# Patient Record
Sex: Female | Born: 1950
Health system: Southern US, Community
[De-identification: ages and names within clinical notes are randomized; demographics above are authoritative.]

## PROBLEM LIST (undated history)

## (undated) DIAGNOSIS — K5792 Diverticulitis of intestine, part unspecified, without perforation or abscess without bleeding: Secondary | ICD-10-CM

## (undated) DIAGNOSIS — R0600 Dyspnea, unspecified: Secondary | ICD-10-CM

## (undated) DIAGNOSIS — I3139 Other pericardial effusion (noninflammatory): Secondary | ICD-10-CM

## (undated) DIAGNOSIS — R32 Unspecified urinary incontinence: Secondary | ICD-10-CM

## (undated) DIAGNOSIS — I509 Heart failure, unspecified: Secondary | ICD-10-CM

## (undated) DIAGNOSIS — R1310 Dysphagia, unspecified: Secondary | ICD-10-CM

## (undated) DIAGNOSIS — E66813 Obesity, class 3: Secondary | ICD-10-CM

## (undated) DIAGNOSIS — R072 Precordial pain: Secondary | ICD-10-CM

## (undated) DIAGNOSIS — Z9221 Personal history of antineoplastic chemotherapy: Secondary | ICD-10-CM

## (undated) DIAGNOSIS — M25561 Pain in right knee: Secondary | ICD-10-CM

## (undated) DIAGNOSIS — I313 Pericardial effusion (noninflammatory): Secondary | ICD-10-CM

## (undated) DIAGNOSIS — F5104 Psychophysiologic insomnia: Secondary | ICD-10-CM

## (undated) DIAGNOSIS — M797 Fibromyalgia: Secondary | ICD-10-CM

## (undated) DIAGNOSIS — F32A Depression, unspecified: Secondary | ICD-10-CM

## (undated) DIAGNOSIS — M25562 Pain in left knee: Secondary | ICD-10-CM

## (undated) DIAGNOSIS — F419 Anxiety disorder, unspecified: Secondary | ICD-10-CM

## (undated) DIAGNOSIS — F329 Major depressive disorder, single episode, unspecified: Secondary | ICD-10-CM

## (undated) DIAGNOSIS — K219 Gastro-esophageal reflux disease without esophagitis: Secondary | ICD-10-CM

## (undated) DIAGNOSIS — E785 Hyperlipidemia, unspecified: Secondary | ICD-10-CM

## (undated) DIAGNOSIS — C50919 Malignant neoplasm of unspecified site of unspecified female breast: Secondary | ICD-10-CM

## (undated) DIAGNOSIS — E039 Hypothyroidism, unspecified: Secondary | ICD-10-CM

## (undated) DIAGNOSIS — Z923 Personal history of irradiation: Secondary | ICD-10-CM

## (undated) DIAGNOSIS — R0602 Shortness of breath: Secondary | ICD-10-CM

## (undated) HISTORY — PX: CORONARY ARTERY BYPASS GRAFT: SHX141

## (undated) HISTORY — PX: COLON SURGERY: SHX602

## (undated) HISTORY — PX: NECK SURGERY: SHX720

## (undated) HISTORY — PX: BREAST SURGERY: SHX581

## (undated) HISTORY — PX: CHOLECYSTECTOMY: SHX55

## (undated) HISTORY — PX: TUBAL LIGATION: SHX77

---

## 1998-10-09 DIAGNOSIS — C50919 Malignant neoplasm of unspecified site of unspecified female breast: Secondary | ICD-10-CM

## 1998-10-09 DIAGNOSIS — Z9221 Personal history of antineoplastic chemotherapy: Secondary | ICD-10-CM

## 1998-10-09 DIAGNOSIS — Z923 Personal history of irradiation: Secondary | ICD-10-CM

## 1998-10-09 HISTORY — PX: BREAST LUMPECTOMY WITH AXILLARY LYMPH NODE DISSECTION: SHX5756

## 1998-10-09 HISTORY — PX: BREAST LUMPECTOMY: SHX2

## 1998-10-09 HISTORY — DX: Personal history of antineoplastic chemotherapy: Z92.21

## 1998-10-09 HISTORY — DX: Personal history of irradiation: Z92.3

## 1998-10-09 HISTORY — DX: Malignant neoplasm of unspecified site of unspecified female breast: C50.919

## 2015-08-03 DIAGNOSIS — M797 Fibromyalgia: Secondary | ICD-10-CM | POA: Insufficient documentation

## 2015-08-03 DIAGNOSIS — E039 Hypothyroidism, unspecified: Secondary | ICD-10-CM | POA: Insufficient documentation

## 2015-08-03 DIAGNOSIS — F419 Anxiety disorder, unspecified: Secondary | ICD-10-CM | POA: Insufficient documentation

## 2015-08-03 DIAGNOSIS — K219 Gastro-esophageal reflux disease without esophagitis: Secondary | ICD-10-CM | POA: Insufficient documentation

## 2015-08-11 DIAGNOSIS — E782 Mixed hyperlipidemia: Secondary | ICD-10-CM | POA: Insufficient documentation

## 2015-08-11 DIAGNOSIS — I3139 Other pericardial effusion (noninflammatory): Secondary | ICD-10-CM | POA: Insufficient documentation

## 2015-08-11 DIAGNOSIS — I5022 Chronic systolic (congestive) heart failure: Secondary | ICD-10-CM | POA: Insufficient documentation

## 2015-08-11 DIAGNOSIS — I313 Pericardial effusion (noninflammatory): Secondary | ICD-10-CM | POA: Insufficient documentation

## 2015-11-10 DIAGNOSIS — I5022 Chronic systolic (congestive) heart failure: Secondary | ICD-10-CM | POA: Diagnosis not present

## 2015-11-10 DIAGNOSIS — R0602 Shortness of breath: Secondary | ICD-10-CM | POA: Diagnosis not present

## 2015-12-02 ENCOUNTER — Other Ambulatory Visit: Payer: Self-pay | Admitting: Specialist

## 2015-12-02 DIAGNOSIS — I5022 Chronic systolic (congestive) heart failure: Secondary | ICD-10-CM | POA: Diagnosis not present

## 2015-12-02 DIAGNOSIS — R0602 Shortness of breath: Secondary | ICD-10-CM | POA: Diagnosis not present

## 2015-12-02 DIAGNOSIS — J449 Chronic obstructive pulmonary disease, unspecified: Secondary | ICD-10-CM | POA: Diagnosis not present

## 2015-12-02 DIAGNOSIS — E6609 Other obesity due to excess calories: Secondary | ICD-10-CM | POA: Diagnosis not present

## 2015-12-10 ENCOUNTER — Ambulatory Visit
Admission: RE | Admit: 2015-12-10 | Discharge: 2015-12-10 | Disposition: A | Discharge status: Home / Self Care | Payer: Commercial Managed Care - HMO | Source: Ambulatory Visit | Attending: Specialist | Admitting: Specialist

## 2015-12-10 DIAGNOSIS — R0602 Shortness of breath: Secondary | ICD-10-CM

## 2015-12-17 DIAGNOSIS — I5030 Unspecified diastolic (congestive) heart failure: Secondary | ICD-10-CM | POA: Diagnosis not present

## 2015-12-17 DIAGNOSIS — E039 Hypothyroidism, unspecified: Secondary | ICD-10-CM | POA: Diagnosis not present

## 2015-12-17 DIAGNOSIS — R942 Abnormal results of pulmonary function studies: Secondary | ICD-10-CM | POA: Diagnosis not present

## 2015-12-17 DIAGNOSIS — R0609 Other forms of dyspnea: Secondary | ICD-10-CM | POA: Diagnosis not present

## 2015-12-17 DIAGNOSIS — Z6841 Body Mass Index (BMI) 40.0 and over, adult: Secondary | ICD-10-CM | POA: Insufficient documentation

## 2015-12-22 DIAGNOSIS — Z6841 Body Mass Index (BMI) 40.0 and over, adult: Secondary | ICD-10-CM | POA: Diagnosis not present

## 2015-12-22 DIAGNOSIS — F419 Anxiety disorder, unspecified: Secondary | ICD-10-CM | POA: Diagnosis not present

## 2015-12-22 DIAGNOSIS — E039 Hypothyroidism, unspecified: Secondary | ICD-10-CM | POA: Diagnosis not present

## 2015-12-22 DIAGNOSIS — M797 Fibromyalgia: Secondary | ICD-10-CM | POA: Diagnosis not present

## 2015-12-22 DIAGNOSIS — I5030 Unspecified diastolic (congestive) heart failure: Secondary | ICD-10-CM | POA: Diagnosis not present

## 2015-12-22 DIAGNOSIS — E7800 Pure hypercholesterolemia, unspecified: Secondary | ICD-10-CM | POA: Diagnosis not present

## 2016-02-23 ENCOUNTER — Other Ambulatory Visit: Payer: Self-pay | Admitting: Internal Medicine

## 2016-02-23 DIAGNOSIS — Z0000 Encounter for general adult medical examination without abnormal findings: Secondary | ICD-10-CM | POA: Diagnosis not present

## 2016-02-23 DIAGNOSIS — Z1239 Encounter for other screening for malignant neoplasm of breast: Secondary | ICD-10-CM | POA: Diagnosis not present

## 2016-02-23 DIAGNOSIS — Z23 Encounter for immunization: Secondary | ICD-10-CM | POA: Diagnosis not present

## 2016-03-09 ENCOUNTER — Ambulatory Visit
Admission: RE | Admit: 2016-03-09 | Discharge: 2016-03-09 | Disposition: A | Discharge status: Home / Self Care | Payer: Commercial Managed Care - HMO | Source: Ambulatory Visit | Attending: Internal Medicine | Admitting: Internal Medicine

## 2016-03-09 ENCOUNTER — Other Ambulatory Visit: Payer: Self-pay | Admitting: Internal Medicine

## 2016-03-09 DIAGNOSIS — I5030 Unspecified diastolic (congestive) heart failure: Secondary | ICD-10-CM | POA: Diagnosis not present

## 2016-03-09 DIAGNOSIS — Z1231 Encounter for screening mammogram for malignant neoplasm of breast: Secondary | ICD-10-CM | POA: Insufficient documentation

## 2016-03-09 DIAGNOSIS — R0602 Shortness of breath: Secondary | ICD-10-CM | POA: Diagnosis not present

## 2016-03-09 DIAGNOSIS — Z1239 Encounter for other screening for malignant neoplasm of breast: Secondary | ICD-10-CM

## 2016-03-09 HISTORY — DX: Malignant neoplasm of unspecified site of unspecified female breast: C50.919

## 2016-03-10 DIAGNOSIS — Z1231 Encounter for screening mammogram for malignant neoplasm of breast: Secondary | ICD-10-CM | POA: Diagnosis not present

## 2016-03-22 DIAGNOSIS — E039 Hypothyroidism, unspecified: Secondary | ICD-10-CM | POA: Diagnosis not present

## 2016-03-22 DIAGNOSIS — M797 Fibromyalgia: Secondary | ICD-10-CM | POA: Diagnosis not present

## 2016-03-22 DIAGNOSIS — F419 Anxiety disorder, unspecified: Secondary | ICD-10-CM | POA: Diagnosis not present

## 2016-03-22 DIAGNOSIS — I5022 Chronic systolic (congestive) heart failure: Secondary | ICD-10-CM | POA: Diagnosis not present

## 2016-03-22 DIAGNOSIS — Z6841 Body Mass Index (BMI) 40.0 and over, adult: Secondary | ICD-10-CM | POA: Diagnosis not present

## 2016-06-15 DIAGNOSIS — R32 Unspecified urinary incontinence: Secondary | ICD-10-CM | POA: Insufficient documentation

## 2016-06-15 DIAGNOSIS — R1310 Dysphagia, unspecified: Secondary | ICD-10-CM | POA: Insufficient documentation

## 2016-06-15 DIAGNOSIS — F325 Major depressive disorder, single episode, in full remission: Secondary | ICD-10-CM | POA: Insufficient documentation

## 2016-06-21 ENCOUNTER — Other Ambulatory Visit: Payer: Self-pay | Admitting: Internal Medicine

## 2016-06-21 DIAGNOSIS — R1310 Dysphagia, unspecified: Secondary | ICD-10-CM

## 2016-06-28 ENCOUNTER — Ambulatory Visit: Payer: Commercial Managed Care - HMO

## 2016-07-18 ENCOUNTER — Ambulatory Visit: Payer: Commercial Managed Care - HMO

## 2016-08-22 DIAGNOSIS — M25562 Pain in left knee: Secondary | ICD-10-CM | POA: Insufficient documentation

## 2016-08-22 DIAGNOSIS — M25561 Pain in right knee: Secondary | ICD-10-CM | POA: Insufficient documentation

## 2016-09-02 ENCOUNTER — Emergency Department
Admission: EM | Admit: 2016-09-02 | Discharge: 2016-09-02 | Disposition: A | Discharge status: Home / Self Care | Payer: Medicare Other | Attending: Emergency Medicine | Admitting: Emergency Medicine

## 2016-09-02 ENCOUNTER — Emergency Department: Payer: Medicare Other

## 2016-09-02 DIAGNOSIS — R079 Chest pain, unspecified: Secondary | ICD-10-CM

## 2016-09-02 DIAGNOSIS — R0789 Other chest pain: Secondary | ICD-10-CM | POA: Diagnosis not present

## 2016-09-02 DIAGNOSIS — I509 Heart failure, unspecified: Secondary | ICD-10-CM | POA: Diagnosis not present

## 2016-09-02 HISTORY — DX: Heart failure, unspecified: I50.9

## 2016-09-02 LAB — CBC
HEMATOCRIT: 40 % (ref 35.0–47.0)
HEMOGLOBIN: 13.7 g/dL (ref 12.0–16.0)
MCH: 26.6 pg (ref 26.0–34.0)
MCHC: 34.3 g/dL (ref 32.0–36.0)
MCV: 77.7 fL — ABNORMAL LOW (ref 80.0–100.0)
Platelets: 135 10*3/uL — ABNORMAL LOW (ref 150–440)
RBC: 5.14 MIL/uL (ref 3.80–5.20)
RDW: 15.5 % — ABNORMAL HIGH (ref 11.5–14.5)
WBC: 9.1 10*3/uL (ref 3.6–11.0)

## 2016-09-02 LAB — BASIC METABOLIC PANEL
ANION GAP: 6 (ref 5–15)
BUN: 17 mg/dL (ref 6–20)
CHLORIDE: 106 mmol/L (ref 101–111)
CO2: 29 mmol/L (ref 22–32)
Calcium: 9.4 mg/dL (ref 8.9–10.3)
Creatinine, Ser: 1.17 mg/dL — ABNORMAL HIGH (ref 0.44–1.00)
GFR calc non Af Amer: 47 mL/min — ABNORMAL LOW (ref >60)
GFR, EST AFRICAN AMERICAN: 55 mL/min — AB (ref >60)
GLUCOSE: 103 mg/dL — AB (ref 65–99)
POTASSIUM: 4 mmol/L (ref 3.5–5.1)
Sodium: 141 mmol/L (ref 135–145)

## 2016-09-02 LAB — TROPONIN I
Troponin I: <0.03 ng/mL (ref <0.03)
Troponin I: <0.03 ng/mL (ref <0.03)

## 2016-09-02 LAB — FIBRIN DERIVATIVES D-DIMER (ARMC ONLY): FIBRIN DERIVATIVES D-DIMER (ARMC): 2302 — AB (ref 0–499)

## 2016-09-02 MED ORDER — ASPIRIN 81 MG PO CHEW: 324.0000 mg | CHEWABLE_TABLET | Freq: Once | ORAL | Status: DC

## 2016-09-02 MED ORDER — TRAMADOL HCL 50 MG PO TABS: 50.0000 mg | ORAL_TABLET | Freq: Four times a day (QID) | ORAL | 0 refills | Status: DC | PRN

## 2016-09-02 MED ORDER — IOPAMIDOL (ISOVUE-370) INJECTION 76%
75.0000 mL | Freq: Once | INTRAVENOUS | Status: AC | PRN
  Administered 2016-09-02: 75 mL via INTRAVENOUS

## 2016-09-02 MED ORDER — AZITHROMYCIN 250 MG PO TABS: ORAL_TABLET | ORAL | 0 refills | Status: DC

## 2016-09-02 MED ORDER — KETOROLAC TROMETHAMINE 30 MG/ML IJ SOLN
15.0000 mg | Freq: Once | INTRAMUSCULAR | Status: AC
  Administered 2016-09-02: 15 mg via INTRAVENOUS
  Filled 2016-09-02: qty 1

## 2016-09-02 NOTE — ED Provider Notes (Signed)
Time Seen: Approximately 1022 I have reviewed the triage notes  Chief Complaint: Chest Pain   History of Present Illness: Ann Parker is a 65 y.o. female who presents with some complaints of bilateral chest discomfort and some feeling of "" heaviness "" in both of her upper extremities. She denies any focal weakness. Pain is somewhat worse with palpation across the chest and with deep inspiration. States pain is relatively constant since this morning. She was transported here by EMS and received aspirin therapy.   Past Medical History:  Diagnosis Date  . Breast cancer ([place]) 2000   left lumpectomy  . CHF (congestive heart failure) ([place])     There are no active problems to display for this patient.   Past Surgical History:  Procedure Laterality Date  . BREAST LUMPECTOMY WITH AXILLARY LYMPH NODE DISSECTION Left 2000  . NECK SURGERY      Past Surgical History:  Procedure Laterality Date  . BREAST LUMPECTOMY WITH AXILLARY LYMPH NODE DISSECTION Left 2000  . NECK SURGERY      Current Outpatient Rx  . Order #: [id number] Class: Historical Med  . Order #: [id number] Class: Historical Med  . Order #: [id number] Class: Historical Med  . Order #: [id number] Class: Historical Med  . Order #: [id number] Class: Historical Med  . Order #: [id number] Class: Historical Med  . Order #: [id number] Class: Historical Med  . Order #: [id number] Class: Historical Med  . Order #: [id number] Class: Historical Med  . Order #: [id number] Class: Historical Med  . Order #: [id number] Class: Print  . Order #: [id number] Class: Print    Allergies:  Codeine  Family History: Family History  Problem Relation Age of Onset  . Breast cancer Daughter 59    Social History: Social History  Substance Use Topics  . Smoking status: Never Smoker  . Smokeless tobacco: Never Used  . Alcohol use No     Review of Systems:   10 point review of systems was performed and was otherwise negative:  Constitutional: No  fever Eyes: No visual disturbances ENT: No sore throat, ear pain Cardiac: Bilateral chest discomfort without any known chest wall trauma. Significant history in the past of breast cancer. Respiratory: No shortness of breath, wheezing, or stridor Abdomen: No abdominal pain, no vomiting, No diarrhea Endocrine: No weight loss, No night sweats Extremities: No peripheral edema, cyanosis Skin: No rashes, easy bruising Neurologic: No focal weakness, trouble with speech or swollowing Urologic: No dysuria, Hematuria, or urinary frequency Patient denies any nausea, vomiting, diaphoresis.  Physical Exam:  ED Triage Vitals  Enc Vitals Group     BP 09/02/16 1007 128/80     Pulse Rate 09/02/16 1007 87     Resp 09/02/16 1007 12     Temp 09/02/16 1007 98 F (36.7 C)     Temp Source 09/02/16 1007 Oral     SpO2 09/02/16 1007 99 %     Weight 09/02/16 1001 253 lb (114.8 kg)     Height 09/02/16 1001 5\' 6"  (1.676 m)     Head Circumference --      Peak Flow --      Pain Score 09/02/16 1124 7     Pain Loc --      Pain Edu? --      Excl. in [place]? --     General: Awake , Alert , and Oriented times 3; GCS 15 Head: Normal cephalic , atraumatic Eyes: Pupils equal , round, reactive to light Nose/Throat: No nasal drainage, patent upper airway without erythema or  exudate.  Neck: Supple, Full range of motion, No anterior adenopathy or palpable thyroid masses Lungs: Clear to ascultation without wheezes , rhonchi, or rales Heart: Regular rate, regular rhythm without murmurs , gallops , or rubs Abdomen: Soft, non tender without rebound, guarding , or rigidity; bowel sounds positive and symmetric in all 4 quadrants. No organomegaly .        Extremities: 2 plus symmetric pulses. No edema, clubbing or cyanosis Neurologic: normal ambulation, Motor symmetric without deficits, sensory intact Skin: warm, dry, no rashes Patient does have somewhat reproducible upper chest wall pain.  Labs:   All laboratory work  was reviewed including any pertinent negatives or positives listed below:  Labs Reviewed  BASIC METABOLIC PANEL - Abnormal; Notable for the following:       Result Value   Glucose, Bld 103 (*)    Creatinine, Ser 1.17 (*)    GFR calc non Af Amer 47 (*)    GFR calc Af Amer 55 (*)    All other components within normal limits  CBC - Abnormal; Notable for the following:    MCV 77.7 (*)    RDW 15.5 (*)    Platelets 135 (*)    All other components within normal limits  FIBRIN DERIVATIVES D-DIMER ([facility] ONLY) - Abnormal; Notable for the following:    Fibrin derivatives D-dimer [facility]) 2,302 (*)    All other components within normal limits  TROPONIN I  TROPONIN I  Outside of an elevated D-dimer test all other laboratory work within normal limits.  EKG: * ED ECG REPORT I, Daymon Larsen, the attending physician, personally viewed and interpreted this ECG.  Date: 09/02/2016 EKG Time1010 Rate: 83 Rhythm: normal sinus rhythm with occasional PVCs QRS Axis: normal Intervals: normal ST/T Wave abnormalities: normal Conduction Disturbances: none Narrative Interpretation: unremarkable No acute ischemic changes noted   Radiology:   "Dg Chest 2 View  Result Date: 09/02/2016 CLINICAL DATA:  Shortness of breath EXAM: CHEST  2 VIEW COMPARISON:  None. FINDINGS: Surgical hardware from ACDF overlies the lower cervical spine. Left axillary surgical clips are noted. Normal heart size. Normal mediastinal contour. No pneumothorax. No pleural effusion. Lungs appear clear, with no acute consolidative airspace disease and no pulmonary edema. Surgical clips are seen in the right upper quadrant of the abdomen. IMPRESSION: No active cardiopulmonary disease. Electronically Signed   By: Ilona Sorrel M.D.   On: 09/02/2016 10:30   Ct Angio Chest Pe W Or Wo Contrast  Result Date: 09/02/2016 CLINICAL DATA:  Pt c/o SOB and heaviness on sternal aspect of chest x 2am today. Hx CABG, breast cancer in 2000-treated  with chemo/radiation. EXAM: CT ANGIOGRAPHY CHEST WITH CONTRAST TECHNIQUE: Multidetector CT imaging of the chest was performed using the standard protocol during bolus administration of intravenous contrast. Multiplanar CT image reconstructions and MIPs were obtained to evaluate the vascular anatomy. CONTRAST:  75 cc Isovue 370 COMPARISON:  None. FINDINGS: Cardiovascular: No pulmonary embolism identified within the main, lobar or segmental pulmonary arteries bilaterally. No aortic aneurysm or dissection. Heart size is normal. No pericardial effusion. Coronary artery calcifications noted, particularly dense at the junction of the left main and left anterior descending coronary artery. Mediastinum/Nodes: No mass or enlarged lymph nodes within the mediastinum, perihilar or axillary regions. Esophagus appears normal. Trachea and central bronchi are unremarkable. Lungs/Pleura: Small patchy sub-solid consolidations noted within the right lower lobe. Mild scarring/fibrosis at the right lung apex. Lungs otherwise clear. No pleural effusion or pneumothorax seen. Upper Abdomen: Status  post cholecystectomy. Limited images of the upper abdomen are otherwise unremarkable. Musculoskeletal: Mild degenerative spurring within the thoracic spine. No acute or suspicious osseous finding. Surgical clips in the left axilla, presumably from axillary lymph node dissection. Review of the MIP images confirms the above findings. IMPRESSION: 1. No pulmonary embolism. 2. No aortic aneurysm or dissection. 3. Coronary artery calcifications, particularly dense at the junction of the left main coronary artery and left anterior descending coronary artery. Heart size is normal. No pericardial effusion. 4. Small patchy sub-solid consolidations within the right lower lobe, favored to be atelectasis or pneumonia. Neoplastic process is felt to be less likely. Initial follow-up by chest CT without contrast is recommended in 3 months to confirm persistence.  This recommendation follows the consensus statement: Recommendations for the Management of Subsolid Pulmonary Nodules Detected at CT: A Statement from the [place] as published in Radiology 2013; 266:304-317. Lungs otherwise clear. Electronically Signed   By: Franki Cabot M.D.   On: 09/02/2016 12:12  "   I personally reviewed the radiologic studies   ED Course:  Differential includes all life-threatening causes for chest pain. This includes but is not exclusive to acute coronary syndrome, aortic dissection, pulmonary embolism, cardiac tamponade, community-acquired pneumonia, pericarditis, musculoskeletal chest wall pain, etc.   Given the patient's presentation I was not sure of the exact cause for chest pain. Some of her pain is very reproducible with palpation and yet she describes as underlying heaviness in both upper extremities without any focal neurologic deficits. The patient was observed here and had serial troponins which were negative. She does have some evidence of atherosclerotic disease seen on chest CT evaluation along with some other findings such as a possible infiltrate, etc. (Please see radiology note).Briefly with the cardiologist on call advised to call the office on Monday to speak to Dr. Nehemiah Massed who is her primary cardiologist. She's had a significant history of cardiac tamponade not secondary to Adriamycin from her previous breast cancer treatment. Clinical Course      Assessment:  Acute atypical chest pain Possible community-acquired pneumonia Chest wall pain   Final Clinical Impression:  Final diagnoses:  Chest pain  Atypical chest pain     Plan: Outpatient " New Prescriptions   AZITHROMYCIN (ZITHROMAX Z-PAK) 250 MG TABLET    Take 2 tablets (500 mg) on  Day 1,  followed by 1 tablet (250 mg) once daily on Days 2 through 5.   TRAMADOL (ULTRAM) 50 MG TABLET    Take 1 tablet (50 mg total) by mouth every 6 (six) hours as needed.  " Patient was  advised to return immediately if condition worsens. Patient was advised to follow up with their primary care physician or other specialized physicians involved in their outpatient care. The patient and/or family member/power of attorney had laboratory results reviewed at the bedside. All questions and concerns were addressed and appropriate discharge instructions were distributed by the nursing staff.            Daymon Larsen, MD 09/02/16 [phone]

## 2016-09-02 NOTE — ED Triage Notes (Addendum)
Pt came to ED via EMS from home c/o chest pressure that started this morning. Pt reports feeling heaviness in both arms and under both breasts. History of breast cancer, cancer free since 2012. Pt took 324 asa before arrival.

## 2016-09-02 NOTE — Discharge Instructions (Signed)
Please return immediately if condition worsens. Please contact her primary physician or the physician you were given for referral. If you have any specialist physicians involved in her treatment and plan please also contact them. Thank you for using [facility].  Please take 4 baby aspirin a day contact her cardiologist on Monday for follow-up as soon as possible.

## 2016-11-07 ENCOUNTER — Other Ambulatory Visit: Payer: Self-pay | Admitting: Specialist

## 2016-11-07 DIAGNOSIS — R0609 Other forms of dyspnea: Secondary | ICD-10-CM

## 2016-11-07 DIAGNOSIS — R918 Other nonspecific abnormal finding of lung field: Secondary | ICD-10-CM

## 2016-11-22 ENCOUNTER — Ambulatory Visit
Admission: RE | Admit: 2016-11-22 | Discharge: 2016-11-22 | Disposition: A | Discharge status: Home / Self Care | Payer: Medicare Other | Source: Ambulatory Visit | Attending: Specialist | Admitting: Specialist

## 2016-11-22 DIAGNOSIS — R0609 Other forms of dyspnea: Secondary | ICD-10-CM | POA: Insufficient documentation

## 2016-11-22 DIAGNOSIS — R918 Other nonspecific abnormal finding of lung field: Secondary | ICD-10-CM | POA: Insufficient documentation

## 2016-12-16 DIAGNOSIS — F5104 Psychophysiologic insomnia: Secondary | ICD-10-CM | POA: Insufficient documentation

## 2016-12-26 ENCOUNTER — Other Ambulatory Visit: Payer: Self-pay | Admitting: Student

## 2016-12-26 DIAGNOSIS — R1310 Dysphagia, unspecified: Secondary | ICD-10-CM

## 2016-12-26 DIAGNOSIS — R109 Unspecified abdominal pain: Secondary | ICD-10-CM

## 2016-12-26 DIAGNOSIS — R140 Abdominal distension (gaseous): Secondary | ICD-10-CM

## 2017-01-01 ENCOUNTER — Ambulatory Visit
Admission: RE | Admit: 2017-01-01 | Discharge: 2017-01-01 | Disposition: A | Discharge status: Home / Self Care | Payer: Medicare Other | Source: Ambulatory Visit | Attending: Student | Admitting: Student

## 2017-01-01 DIAGNOSIS — N281 Cyst of kidney, acquired: Secondary | ICD-10-CM | POA: Diagnosis not present

## 2017-01-01 DIAGNOSIS — K228 Other specified diseases of esophagus: Secondary | ICD-10-CM | POA: Diagnosis not present

## 2017-01-01 DIAGNOSIS — R932 Abnormal findings on diagnostic imaging of liver and biliary tract: Secondary | ICD-10-CM | POA: Diagnosis not present

## 2017-01-01 DIAGNOSIS — K222 Esophageal obstruction: Secondary | ICD-10-CM | POA: Insufficient documentation

## 2017-01-01 DIAGNOSIS — R109 Unspecified abdominal pain: Secondary | ICD-10-CM | POA: Diagnosis not present

## 2017-01-01 DIAGNOSIS — R161 Splenomegaly, not elsewhere classified: Secondary | ICD-10-CM | POA: Diagnosis not present

## 2017-01-01 DIAGNOSIS — R140 Abdominal distension (gaseous): Secondary | ICD-10-CM | POA: Diagnosis not present

## 2017-01-01 DIAGNOSIS — Z9049 Acquired absence of other specified parts of digestive tract: Secondary | ICD-10-CM | POA: Diagnosis not present

## 2017-01-01 DIAGNOSIS — Q8909 Congenital malformations of spleen: Secondary | ICD-10-CM | POA: Insufficient documentation

## 2017-01-01 DIAGNOSIS — R1310 Dysphagia, unspecified: Secondary | ICD-10-CM | POA: Diagnosis not present

## 2017-01-05 ENCOUNTER — Encounter: Payer: Self-pay | Admitting: *Deleted

## 2017-01-05 ENCOUNTER — Other Ambulatory Visit: Payer: Self-pay | Admitting: Student

## 2017-01-05 DIAGNOSIS — R933 Abnormal findings on diagnostic imaging of other parts of digestive tract: Secondary | ICD-10-CM

## 2017-01-05 DIAGNOSIS — N2889 Other specified disorders of kidney and ureter: Secondary | ICD-10-CM

## 2017-01-08 ENCOUNTER — Ambulatory Visit
Admission: RE | Admit: 2017-01-08 | Discharge: 2017-01-08 | Disposition: A | Discharge status: Home / Self Care | Payer: Medicare Other | Source: Ambulatory Visit | Attending: Unknown Physician Specialty | Admitting: Unknown Physician Specialty

## 2017-01-08 ENCOUNTER — Encounter: Payer: Self-pay | Admitting: Anesthesiology

## 2017-01-08 ENCOUNTER — Ambulatory Visit: Payer: Medicare Other | Admitting: Anesthesiology

## 2017-01-08 ENCOUNTER — Encounter
Admission: RE | Disposition: A | Discharge status: Home / Self Care | Payer: Self-pay | Source: Ambulatory Visit | Attending: Unknown Physician Specialty

## 2017-01-08 DIAGNOSIS — Z6839 Body mass index (BMI) 39.0-39.9, adult: Secondary | ICD-10-CM | POA: Insufficient documentation

## 2017-01-08 DIAGNOSIS — I509 Heart failure, unspecified: Secondary | ICD-10-CM | POA: Insufficient documentation

## 2017-01-08 DIAGNOSIS — K219 Gastro-esophageal reflux disease without esophagitis: Secondary | ICD-10-CM | POA: Insufficient documentation

## 2017-01-08 DIAGNOSIS — Z951 Presence of aortocoronary bypass graft: Secondary | ICD-10-CM | POA: Insufficient documentation

## 2017-01-08 DIAGNOSIS — F419 Anxiety disorder, unspecified: Secondary | ICD-10-CM | POA: Insufficient documentation

## 2017-01-08 DIAGNOSIS — Z7951 Long term (current) use of inhaled steroids: Secondary | ICD-10-CM | POA: Insufficient documentation

## 2017-01-08 DIAGNOSIS — F329 Major depressive disorder, single episode, unspecified: Secondary | ICD-10-CM | POA: Insufficient documentation

## 2017-01-08 DIAGNOSIS — Z79899 Other long term (current) drug therapy: Secondary | ICD-10-CM | POA: Insufficient documentation

## 2017-01-08 DIAGNOSIS — M797 Fibromyalgia: Secondary | ICD-10-CM | POA: Insufficient documentation

## 2017-01-08 DIAGNOSIS — Z853 Personal history of malignant neoplasm of breast: Secondary | ICD-10-CM | POA: Insufficient documentation

## 2017-01-08 DIAGNOSIS — E039 Hypothyroidism, unspecified: Secondary | ICD-10-CM | POA: Insufficient documentation

## 2017-01-08 DIAGNOSIS — Z7902 Long term (current) use of antithrombotics/antiplatelets: Secondary | ICD-10-CM | POA: Diagnosis not present

## 2017-01-08 DIAGNOSIS — R1310 Dysphagia, unspecified: Secondary | ICD-10-CM | POA: Insufficient documentation

## 2017-01-08 DIAGNOSIS — K222 Esophageal obstruction: Secondary | ICD-10-CM | POA: Diagnosis not present

## 2017-01-08 DIAGNOSIS — Z87891 Personal history of nicotine dependence: Secondary | ICD-10-CM | POA: Insufficient documentation

## 2017-01-08 HISTORY — DX: Hypothyroidism, unspecified: E03.9

## 2017-01-08 HISTORY — DX: Dysphagia, unspecified: R13.10

## 2017-01-08 HISTORY — DX: Anxiety disorder, unspecified: F41.9

## 2017-01-08 HISTORY — DX: Precordial pain: R07.2

## 2017-01-08 HISTORY — DX: Depression, unspecified: F32.A

## 2017-01-08 HISTORY — DX: Morbid (severe) obesity due to excess calories: E66.01

## 2017-01-08 HISTORY — DX: Pain in left knee: M25.562

## 2017-01-08 HISTORY — DX: Pericardial effusion (noninflammatory): I31.3

## 2017-01-08 HISTORY — DX: Pain in left knee: M25.561

## 2017-01-08 HISTORY — DX: Gastro-esophageal reflux disease without esophagitis: K21.9

## 2017-01-08 HISTORY — DX: Unspecified urinary incontinence: R32

## 2017-01-08 HISTORY — DX: Fibromyalgia: M79.7

## 2017-01-08 HISTORY — DX: Major depressive disorder, single episode, unspecified: F32.9

## 2017-01-08 HISTORY — DX: Other pericardial effusion (noninflammatory): I31.39

## 2017-01-08 HISTORY — PX: ESOPHAGOGASTRODUODENOSCOPY (EGD) WITH PROPOFOL: SHX5813

## 2017-01-08 HISTORY — DX: Psychophysiologic insomnia: F51.04

## 2017-01-08 HISTORY — DX: Shortness of breath: R06.02

## 2017-01-08 HISTORY — DX: Obesity, class 3: E66.813

## 2017-01-08 HISTORY — DX: Hyperlipidemia, unspecified: E78.5

## 2017-01-08 SURGERY — ESOPHAGOGASTRODUODENOSCOPY (EGD) WITH PROPOFOL
Anesthesia: General

## 2017-01-08 MED ORDER — SODIUM CHLORIDE 0.9 % IV SOLN
INTRAVENOUS | Status: DC
  Administered 2017-01-08: 14:00:00 via INTRAVENOUS

## 2017-01-08 MED ORDER — PROPOFOL 500 MG/50ML IV EMUL
INTRAVENOUS | Status: DC | PRN
  Administered 2017-01-08: 150 ug/kg/min via INTRAVENOUS

## 2017-01-08 MED ORDER — GLYCOPYRROLATE 0.2 MG/ML IJ SOLN
INTRAMUSCULAR | Status: DC | PRN
  Administered 2017-01-08: 0.2 mg via INTRAVENOUS

## 2017-01-08 MED ORDER — PROPOFOL 500 MG/50ML IV EMUL: INTRAVENOUS | Status: DC | PRN

## 2017-01-08 MED ORDER — PROPOFOL 500 MG/50ML IV EMUL
INTRAVENOUS | Status: AC
  Filled 2017-01-08: qty 50

## 2017-01-08 MED ORDER — PROPOFOL 10 MG/ML IV BOLUS
INTRAVENOUS | Status: DC | PRN
  Administered 2017-01-08: 50 mg via INTRAVENOUS
  Administered 2017-01-08: 30 mg via INTRAVENOUS
  Administered 2017-01-08: 40 mg via INTRAVENOUS

## 2017-01-08 MED ORDER — PROPOFOL 10 MG/ML IV BOLUS
INTRAVENOUS | Status: AC
  Filled 2017-01-08: qty 20

## 2017-01-08 MED ORDER — SODIUM CHLORIDE 0.9 % IV SOLN: INTRAVENOUS | Status: DC

## 2017-01-08 NOTE — Anesthesia Post-op Follow-up Note (Cosign Needed)
Anesthesia QCDR form completed.        

## 2017-01-08 NOTE — Transfer of Care (Signed)
Immediate Anesthesia Transfer of Care Note  Patient: Ann Parker  Procedure(s) Performed: Procedure(s): ESOPHAGOGASTRODUODENOSCOPY (EGD) WITH PROPOFOL (N/A)  Patient Location: PACU  Anesthesia Type:General  Level of Consciousness: responds to stimulation  Airway & Oxygen Therapy: Patient Spontanous Breathing and Patient connected to nasal cannula oxygen  Post-op Assessment: Report given to RN and Post -op Vital signs reviewed and stable  Post vital signs: Reviewed and stable  Last Vitals:  Vitals:   01/08/17 1313 01/08/17 1533  BP: 120/70 (!) 85/52  Pulse: 75 (!) 58  Resp: 20 (!) 21  Temp: 36.6 C 36.5 C    Last Pain:  Vitals:   01/08/17 1533  TempSrc: Tympanic      Patients Stated Pain Goal: 0 (30/07/62 2633)  Complications: No apparent anesthesia complications

## 2017-01-08 NOTE — H&P (Signed)
Primary Care Physician:  Glendon Axe, MD Primary Gastroenterologist:  Dr. Vira Agar  Pre-Procedure History & Physical: HPI:  Ann Parker is a 66 y.o. female is here for an endoscopy.   Past Medical History:  Diagnosis Date  . Anxiety   . Bilateral anterior knee pain   . Breast cancer ([place]) 2000   left lumpectomy  . CHF (congestive heart failure) ([place])   . CHF (congestive heart failure) ([place])   . Chronic insomnia   . Depression   . Dysphagia   . Fibromyalgia   . GERD (gastroesophageal reflux disease)   . Hyperlipidemia   . Hypothyroidism   . Obesity, Class III, BMI 40-49.9 (morbid obesity) ([place])   . Pericardial effusion   . Precordial pain   . SOB (shortness of breath)   . Urinary incontinence in female     Past Surgical History:  Procedure Laterality Date  . BREAST LUMPECTOMY WITH AXILLARY LYMPH NODE DISSECTION Left 2000  . BREAST SURGERY    . CORONARY ARTERY BYPASS GRAFT    . NECK SURGERY      Prior to Admission medications   Medication Sig Start Date End Date Taking? Authorizing Provider  albuterol (PROVENTIL HFA;VENTOLIN HFA) 108 (90 Base) MCG/ACT inhaler Inhale into the lungs every 6 (six) hours as needed for wheezing or shortness of breath.   Yes Historical Provider, MD  budesonide-formoterol (SYMBICORT) 80-4.5 MCG/ACT inhaler Inhale 2 puffs into the lungs 2 (two) times daily.   Yes Historical Provider, MD  diazepam (VALIUM) 5 MG tablet Take 5 mg by mouth 2 (two) times daily as needed for anxiety.   Yes Historical Provider, MD  furosemide (LASIX) 40 MG tablet Take 40 mg by mouth daily.   Yes Historical Provider, MD  levothyroxine (SYNTHROID, LEVOTHROID) 75 MCG tablet Take 75 mcg by mouth daily before breakfast.   Yes Historical Provider, MD  metoprolol succinate (TOPROL-XL) 50 MG 24 hr tablet Take 50 mg by mouth daily. Take with or immediately following a meal.   Yes Historical Provider, MD  potassium chloride (K-DUR,KLOR-CON) 10 MEQ tablet Take 10 mEq by  mouth 2 (two) times daily.   Yes Historical Provider, MD  pregabalin (LYRICA) 150 MG capsule Take 150 mg by mouth 3 (three) times daily.   Yes Historical Provider, MD  traZODone (DESYREL) 50 MG tablet Take 50 mg by mouth at bedtime.   Yes Historical Provider, MD  azithromycin (ZITHROMAX Z-PAK) 250 MG tablet Take 2 tablets (500 mg) on  Day 1,  followed by 1 tablet (250 mg) once daily on Days 2 through 5. Patient not taking: Reported on 01/08/2017 09/02/16   Daymon Larsen, MD  cilostazol (PLETAL) 100 MG tablet Take 100 mg by mouth 2 (two) times daily.    Historical Provider, MD  pantoprazole (PROTONIX) 40 MG tablet Take 40 mg by mouth daily.    Historical Provider, MD  traMADol (ULTRAM) 50 MG tablet Take 1 tablet (50 mg total) by mouth every 6 (six) hours as needed. Patient not taking: Reported on 01/08/2017 09/02/16   Daymon Larsen, MD    Allergies as of 01/03/2017 - Review Complete 09/02/2016  Allergen Reaction Noted  . Codeine  09/02/2016    Family History  Problem Relation Age of Onset  . Breast cancer Daughter 58    Social History   Social History  . Marital status: Single    Spouse name: N/A  . Number of children: N/A  . Years of education: N/A   Occupational History  .  Not on file.   Social History Main Topics  . Smoking status: Former Research scientist (life sciences)  . Smokeless tobacco: Never Used  . Alcohol use No  . Drug use: No  . Sexual activity: Not on file   Other Topics Concern  . Not on file   Social History Narrative  . No narrative on file    Review of Systems: See HPI, otherwise negative ROS  Physical Exam: BP 120/70   Pulse 75   Temp 97.9 F (36.6 C) (Tympanic)   Resp 20   Ht 5\' 6"  (1.676 m)   Wt 111.6 kg (246 lb)   SpO2 99%   BMI 39.71 kg/m  General:   Alert,  pleasant and cooperative in NAD Head:  Normocephalic and atraumatic. Neck:  Supple; no masses or thyromegaly. Lungs:  Clear throughout to auscultation.    Heart:  Regular rate and rhythm. Abdomen:   Soft, nontender and nondistended. Normal bowel sounds, without guarding, and without rebound.   Neurologic:  Alert and  oriented x4;  grossly normal neurologically.  Impression/Plan: Ann Parker is here for an endoscopy to be performed for dysphagia  Risks, benefits, limitations, and alternatives regarding  endoscopy have been reviewed with the patient.  Questions have been answered.  All parties agreeable.   Gaylyn Cheers, MD  01/08/2017, 3:15 PM

## 2017-01-08 NOTE — Anesthesia Postprocedure Evaluation (Signed)
Anesthesia Post Note  Patient: Ann Parker  Procedure(s) Performed: Procedure(s) (LRB): ESOPHAGOGASTRODUODENOSCOPY (EGD) WITH PROPOFOL (N/A)  Patient location during evaluation: PACU Anesthesia Type: General Level of consciousness: awake and alert and oriented Pain management: pain level controlled Vital Signs Assessment: post-procedure vital signs reviewed and stable Respiratory status: spontaneous breathing Cardiovascular status: blood pressure returned to baseline Anesthetic complications: no     Last Vitals:  Vitals:   01/08/17 1553 01/08/17 1603  BP: (!) 111/58 103/63  Pulse: 66 81  Resp: 16 18  Temp:      Last Pain:  Vitals:   01/08/17 1533  TempSrc: Tympanic                 Jorey Dollard

## 2017-01-08 NOTE — Anesthesia Preprocedure Evaluation (Addendum)
Anesthesia Evaluation  Patient identified by MRN, date of birth, ID band Patient awake    Reviewed: Allergy & Precautions, NPO status , Patient's Chart, lab work & pertinent test results, reviewed documented beta blocker date and time   Airway Mallampati: III  TM Distance: >3 FB     Dental  (+) Chipped, Missing, Poor Dentition   Pulmonary former smoker,           Cardiovascular + CABG and +CHF       Neuro/Psych PSYCHIATRIC DISORDERS Anxiety Depression    GI/Hepatic GERD  ,  Endo/Other  Hypothyroidism   Renal/GU      Musculoskeletal  (+) Fibromyalgia -  Abdominal   Peds  Hematology   Anesthesia Other Findings   Reproductive/Obstetrics                            Anesthesia Physical Anesthesia Plan  ASA: III  Anesthesia Plan: General   Post-op Pain Management:    Induction: Intravenous  Airway Management Planned:   Additional Equipment:   Intra-op Plan:   Post-operative Plan:   Informed Consent: I have reviewed the patients History and Physical, chart, labs and discussed the procedure including the risks, benefits and alternatives for the proposed anesthesia with the patient or authorized representative who has indicated his/her understanding and acceptance.     Plan Discussed with: CRNA  Anesthesia Plan Comments:         Anesthesia Quick Evaluation

## 2017-01-08 NOTE — Op Note (Addendum)
[facility] Patient Name: Ann Parker Procedure Date: 01/08/2017 3:09 PM MRN: [id number] Account #: [id number] Date of Birth: Sep 02, 1951 Admit Type: Outpatient Age: 66 Room: [facility] ENDO ROOM 3 Gender: Female Note Status: Finalized Procedure:            Upper GI endoscopy Indications:          Dysphagia Providers:            Manya Silvas, MD Referring MD:         Glendon Axe (Referring MD) Medicines:            Propofol per Anesthesia Complications:        No immediate complications. Procedure:            Pre-Anesthesia Assessment:                       - After reviewing the risks and benefits, the patient                        was deemed in satisfactory condition to undergo the                        procedure.                       After obtaining informed consent, the endoscope was                        passed under direct vision. Throughout the procedure,                        the patient's blood pressure, pulse, and oxygen                        saturations were monitored continuously. The Endoscope                        was introduced through the mouth, and advanced to the                        second part of duodenum. The upper GI endoscopy was                        accomplished without difficulty. The patient tolerated                        the procedure well. Findings:      A mild Schatzki ring (acquired) was found at the gastroesophageal       junction. At the end of the procedure A guidewire was placed and the       scope was withdrawn. Dilation was performed with a Savary dilator with       mild resistance at 15 mm, 16 mm and 17 mm.      The entire examined stomach was normal.      The examined duodenum was normal. Impression:           - Mild Schatzki ring. Dilated.                       - Normal stomach.                       -  Normal examined duodenum.                       - No specimens collected. Recommendation:        - soft food for 3 days, eat slowly, chew well, take                        small bites. Take medicine as usual. Manya Silvas, MD 01/08/2017 3:30:00 PM This report has been signed electronically. Number of Addenda: 0 Note Initiated On: 01/08/2017 3:09 PM      [facility]

## 2017-01-09 ENCOUNTER — Ambulatory Visit
Admission: RE | Admit: 2017-01-09 | Discharge: 2017-01-09 | Disposition: A | Discharge status: Home / Self Care | Payer: Medicare Other | Source: Ambulatory Visit | Attending: Student | Admitting: Student

## 2017-01-09 ENCOUNTER — Encounter: Payer: Self-pay | Admitting: Unknown Physician Specialty

## 2017-01-09 ENCOUNTER — Other Ambulatory Visit
Admission: RE | Admit: 2017-01-09 | Discharge: 2017-01-09 | Disposition: A | Discharge status: Home / Self Care | Payer: Medicare Other | Source: Ambulatory Visit | Attending: Student | Admitting: Student

## 2017-01-09 DIAGNOSIS — N281 Cyst of kidney, acquired: Secondary | ICD-10-CM | POA: Diagnosis not present

## 2017-01-09 DIAGNOSIS — N2889 Other specified disorders of kidney and ureter: Secondary | ICD-10-CM | POA: Insufficient documentation

## 2017-01-09 DIAGNOSIS — R933 Abnormal findings on diagnostic imaging of other parts of digestive tract: Secondary | ICD-10-CM | POA: Insufficient documentation

## 2017-01-09 DIAGNOSIS — R161 Splenomegaly, not elsewhere classified: Secondary | ICD-10-CM | POA: Diagnosis not present

## 2017-01-09 DIAGNOSIS — R140 Abdominal distension (gaseous): Secondary | ICD-10-CM | POA: Insufficient documentation

## 2017-01-09 DIAGNOSIS — K760 Fatty (change of) liver, not elsewhere classified: Secondary | ICD-10-CM | POA: Diagnosis not present

## 2017-01-09 LAB — BASIC METABOLIC PANEL
Anion gap: 5 (ref 5–15)
BUN: 14 mg/dL (ref 6–20)
CALCIUM: 9.2 mg/dL (ref 8.9–10.3)
CO2: 27 mmol/L (ref 22–32)
CREATININE: 1.07 mg/dL — AB (ref 0.44–1.00)
Chloride: 108 mmol/L (ref 101–111)
GFR calc non Af Amer: 52 mL/min — ABNORMAL LOW (ref >60)
GLUCOSE: 99 mg/dL (ref 65–99)
Potassium: 4.8 mmol/L (ref 3.5–5.1)
Sodium: 140 mmol/L (ref 135–145)

## 2017-01-09 MED ORDER — GADOBENATE DIMEGLUMINE 529 MG/ML IV SOLN
20.0000 mL | Freq: Once | INTRAVENOUS | Status: AC | PRN
  Administered 2017-01-09: 20 mL via INTRAVENOUS

## 2017-01-29 ENCOUNTER — Other Ambulatory Visit: Payer: Self-pay | Admitting: Internal Medicine

## 2017-01-29 DIAGNOSIS — Z1231 Encounter for screening mammogram for malignant neoplasm of breast: Secondary | ICD-10-CM

## 2017-03-12 ENCOUNTER — Ambulatory Visit
Admission: RE | Admit: 2017-03-12 | Discharge: 2017-03-12 | Disposition: A | Discharge status: Home / Self Care | Payer: PPO | Source: Ambulatory Visit | Attending: Internal Medicine | Admitting: Internal Medicine

## 2017-03-12 DIAGNOSIS — Z1231 Encounter for screening mammogram for malignant neoplasm of breast: Secondary | ICD-10-CM | POA: Insufficient documentation

## 2017-03-12 HISTORY — DX: Personal history of antineoplastic chemotherapy: Z92.21

## 2017-03-12 HISTORY — DX: Personal history of irradiation: Z92.3

## 2017-03-23 DIAGNOSIS — K5792 Diverticulitis of intestine, part unspecified, without perforation or abscess without bleeding: Secondary | ICD-10-CM | POA: Diagnosis not present

## 2017-04-09 DIAGNOSIS — E039 Hypothyroidism, unspecified: Secondary | ICD-10-CM | POA: Diagnosis not present

## 2017-04-09 DIAGNOSIS — Z131 Encounter for screening for diabetes mellitus: Secondary | ICD-10-CM | POA: Diagnosis not present

## 2017-04-09 DIAGNOSIS — F3342 Major depressive disorder, recurrent, in full remission: Secondary | ICD-10-CM | POA: Diagnosis not present

## 2017-04-09 DIAGNOSIS — F5104 Psychophysiologic insomnia: Secondary | ICD-10-CM | POA: Diagnosis not present

## 2017-04-09 DIAGNOSIS — M8589 Other specified disorders of bone density and structure, multiple sites: Secondary | ICD-10-CM | POA: Diagnosis not present

## 2017-04-12 DIAGNOSIS — R0602 Shortness of breath: Secondary | ICD-10-CM | POA: Diagnosis not present

## 2017-06-14 ENCOUNTER — Encounter: Payer: Self-pay | Admitting: *Deleted

## 2017-06-14 ENCOUNTER — Other Ambulatory Visit: Payer: Self-pay | Admitting: *Deleted

## 2017-06-14 NOTE — Patient Outreach (Signed)
[facility] Screening call, unsuccessful, someone answered the phone but hung it up immediately X2. I will try again within the week.  Eulah Pont. Myrtie Neither, MSN, [facility] Gerontological Nurse Practitioner [facility] Care Management [phone]

## 2017-06-19 ENCOUNTER — Other Ambulatory Visit: Payer: Self-pay | Admitting: *Deleted

## 2017-06-19 ENCOUNTER — Encounter: Payer: Self-pay | Admitting: *Deleted

## 2017-06-19 NOTE — Addendum Note (Signed)
Addended by: Deloria Lair on: 06/19/2017 01:59 PM   Modules accepted: Orders

## 2017-06-19 NOTE — Patient Outreach (Addendum)
[facility] 2nd Screening call, unsuccessful but left a message for a return call. If I do not hear back from the member I will try again within the week.  Ann Parker. Ann Neither, MSN, GNP-BC Gerontological Nurse Practitioner [facility] Care Management [phone]   [name] returned my call. She is doing well. Sees her provider every 4 months. She will have her AWV on 07/10/17 and get her flu vaccine. She takes her medications as ordered and knows when to call the provider if she is having any problems. I did suggest she start weighing daily so she can identify if her weight goes up and needs to call her provider.  I will send her a successful outreach letter. I have encouraged her to call if we can help in the future.  Ann Parker. Ann Neither, MSN, [facility] Gerontological Nurse Practitioner [facility] Care Management [phone]

## 2017-07-09 DIAGNOSIS — Z131 Encounter for screening for diabetes mellitus: Secondary | ICD-10-CM | POA: Diagnosis not present

## 2017-07-09 DIAGNOSIS — Z23 Encounter for immunization: Secondary | ICD-10-CM | POA: Diagnosis not present

## 2017-07-09 DIAGNOSIS — L819 Disorder of pigmentation, unspecified: Secondary | ICD-10-CM | POA: Diagnosis not present

## 2017-07-09 DIAGNOSIS — Z0000 Encounter for general adult medical examination without abnormal findings: Secondary | ICD-10-CM | POA: Diagnosis not present

## 2017-07-09 DIAGNOSIS — E7800 Pure hypercholesterolemia, unspecified: Secondary | ICD-10-CM | POA: Diagnosis not present

## 2017-07-09 DIAGNOSIS — E039 Hypothyroidism, unspecified: Secondary | ICD-10-CM | POA: Diagnosis not present

## 2017-10-17 DIAGNOSIS — R0609 Other forms of dyspnea: Secondary | ICD-10-CM | POA: Diagnosis not present

## 2017-10-23 DIAGNOSIS — J209 Acute bronchitis, unspecified: Secondary | ICD-10-CM | POA: Diagnosis not present

## 2017-11-20 IMAGING — MG MM DIGITAL SCREENING BILAT W/ TOMO W/ CAD
8 of 14 series · 8 of 30 positions shown · non-contrast
Comparison: Previous exam(s).

CLINICAL DATA: Screening.

EXAM:
2D DIGITAL SCREENING BILATERAL MAMMOGRAM WITH CAD AND ADJUNCT TOMO

[R CC (1 of 2)]
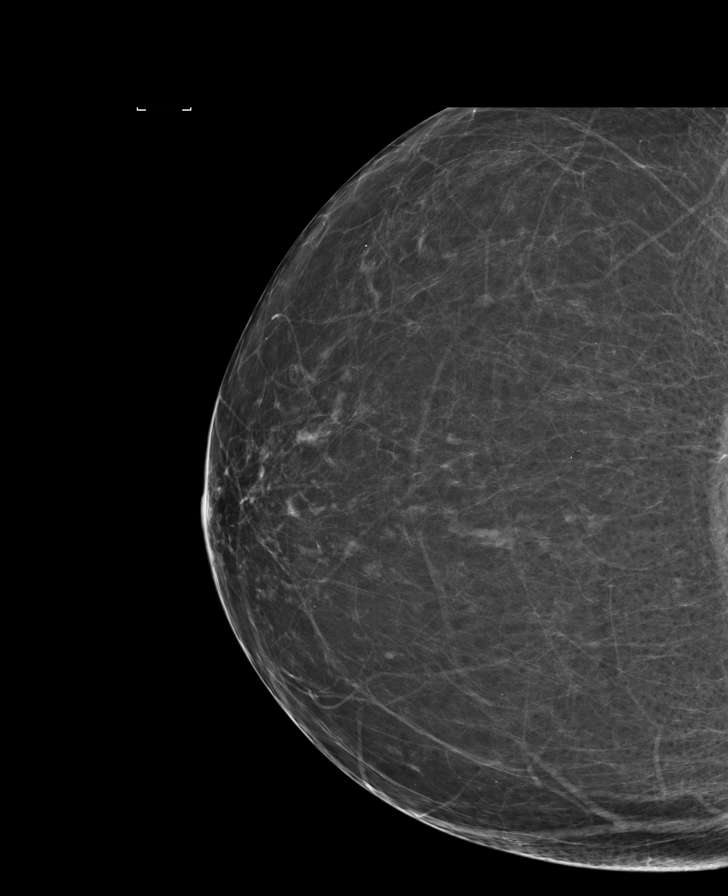

[R MLO]
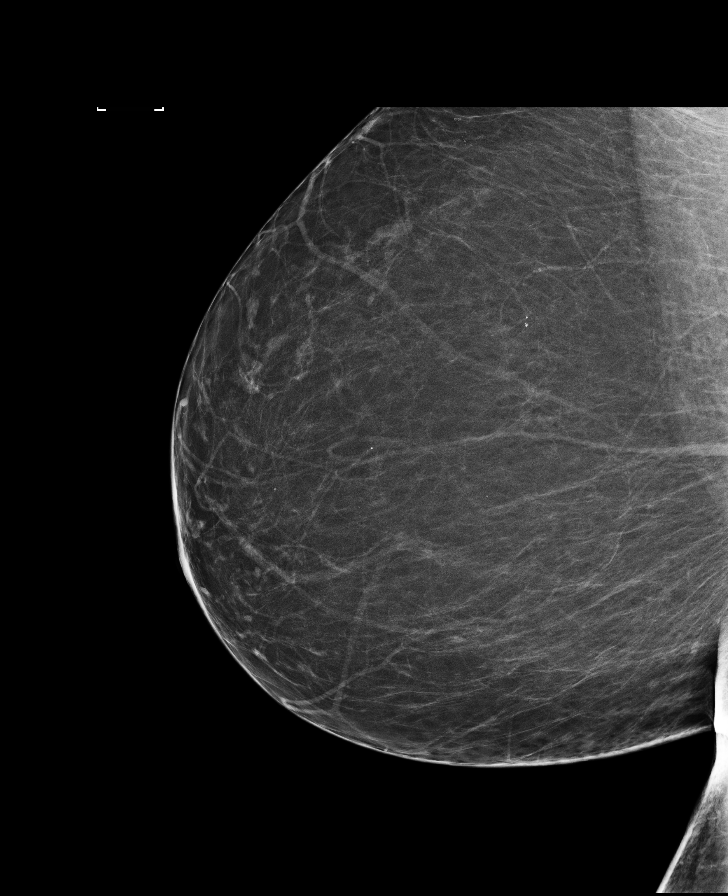

[R CC (2 of 2)]
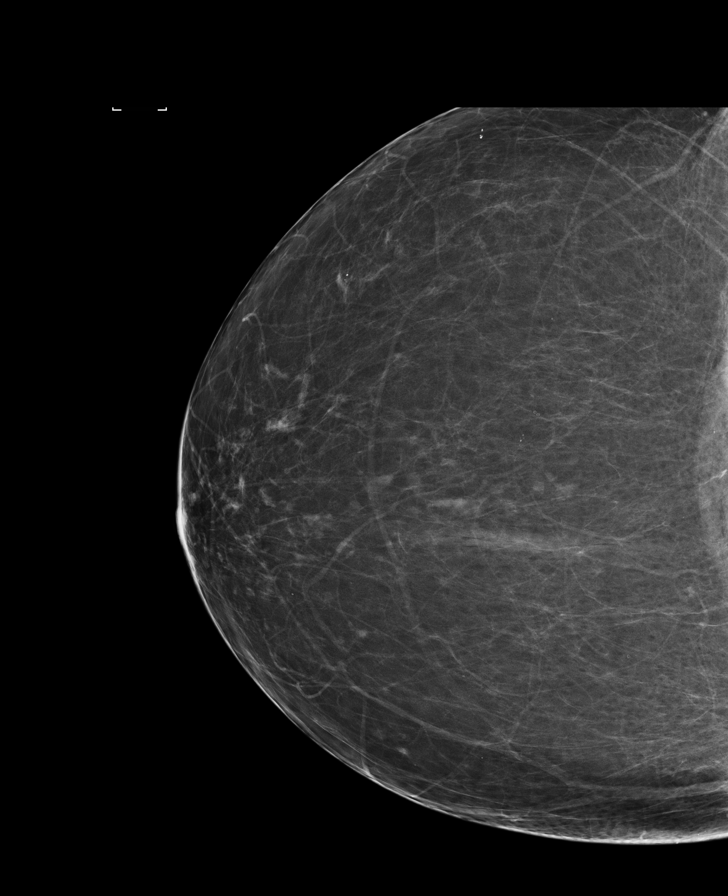

[L MLO]
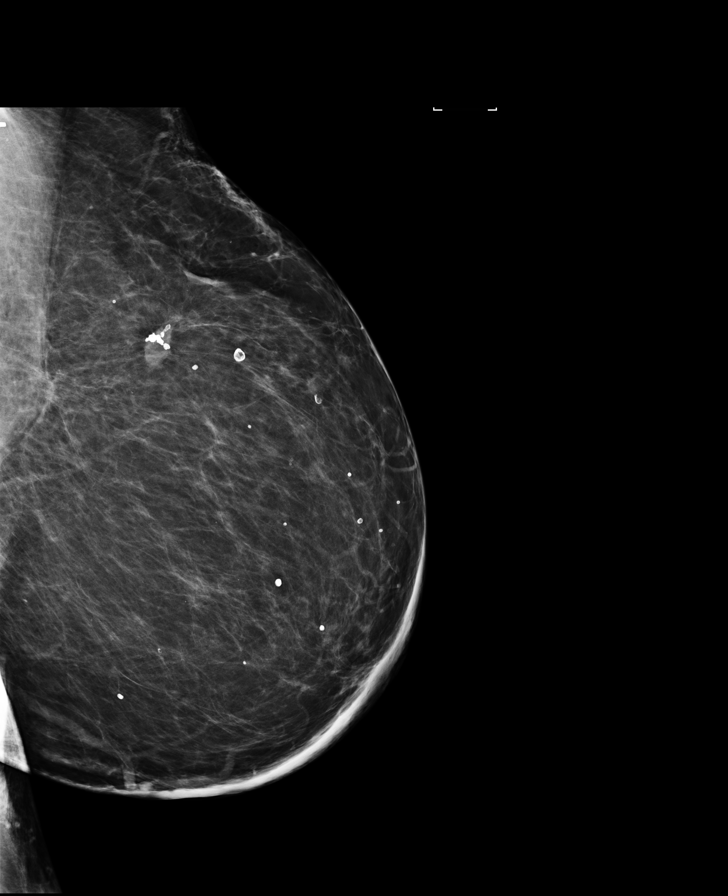

[L CC synth-2D]
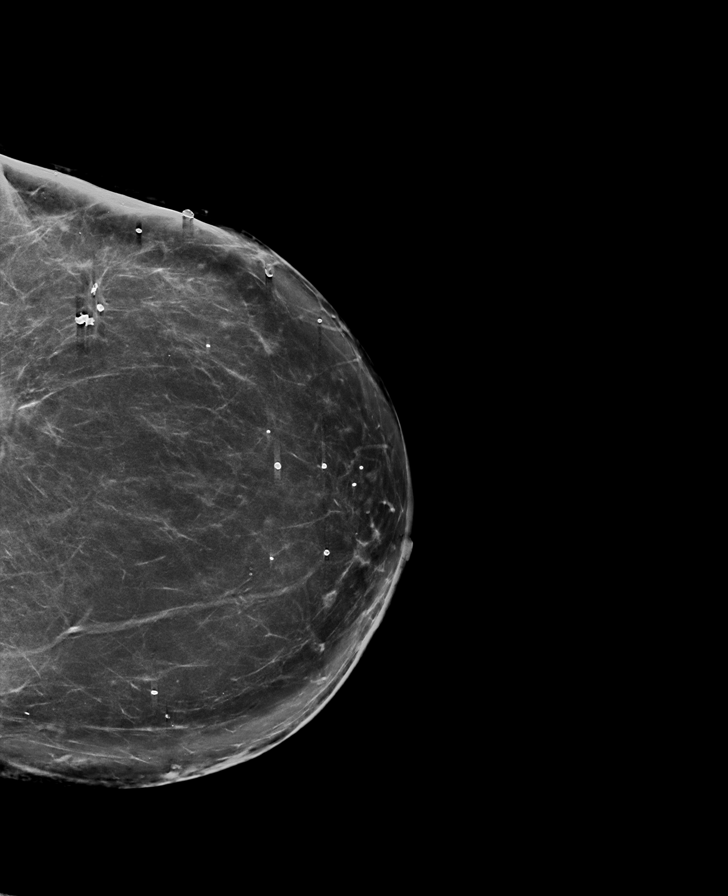

[L CC]
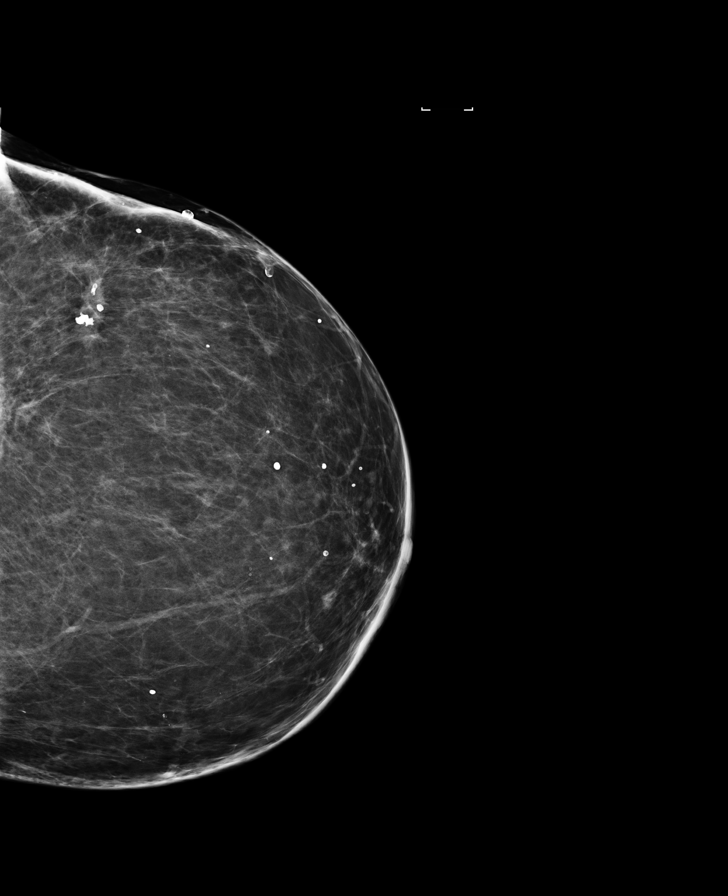

[L MLO synth-2D]
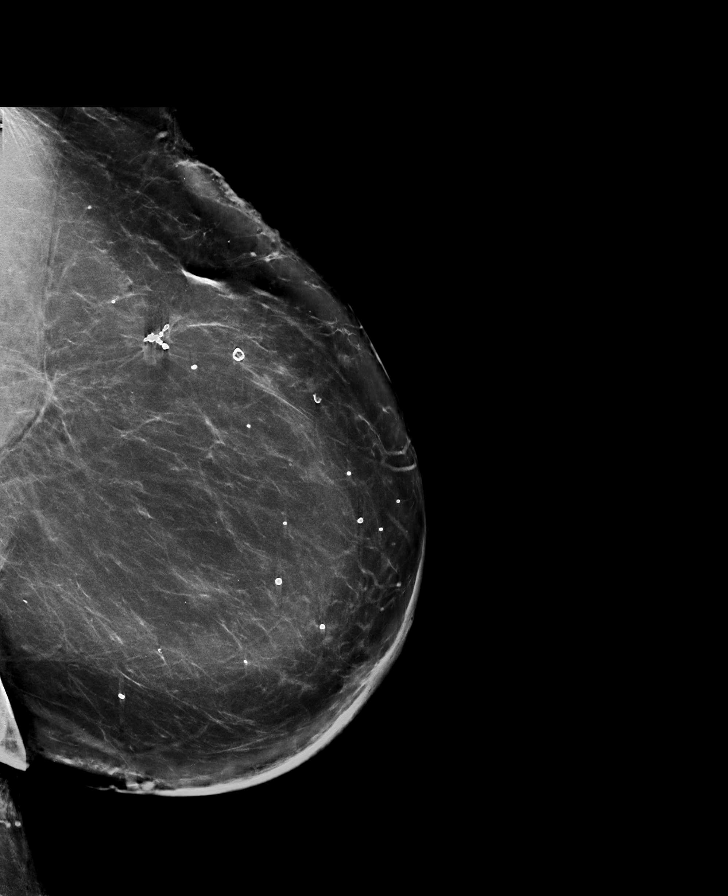

[R MLO synth-2D]
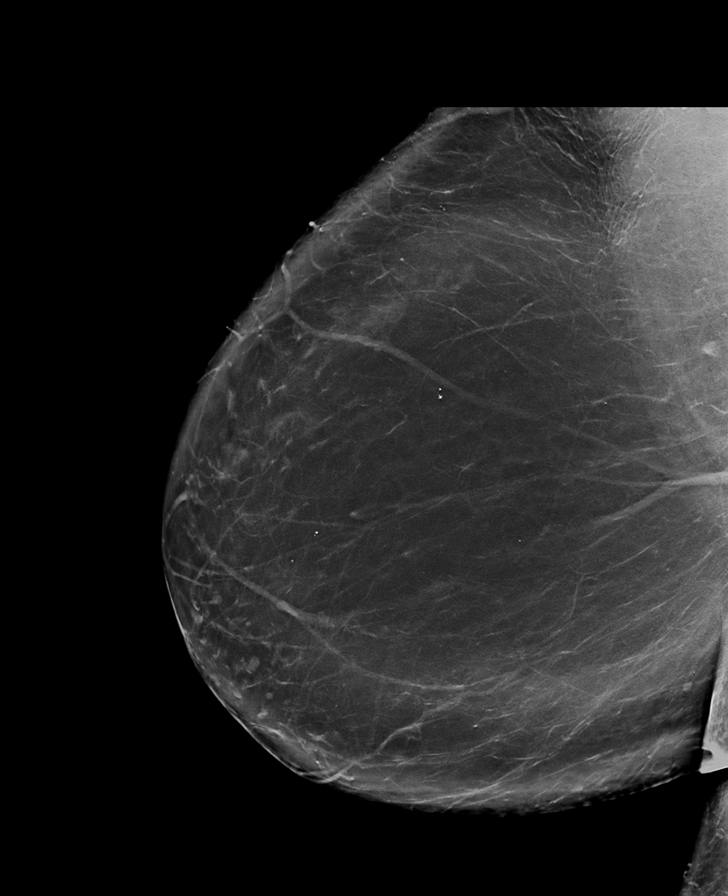

[8 of 30 positions shown; findings below may reference images not displayed]

ACR Breast Density Category b: There are scattered areas of
fibroglandular density.
FINDINGS: There are no findings suspicious for malignancy. Images were
processed with CAD.
IMPRESSION: No mammographic evidence of malignancy. A result letter of this
screening mammogram will be mailed directly to the patient.

RECOMMENDATION:
Screening mammogram in one year. (Code:97-6-RS4)

BI-RADS CATEGORY  1: Negative.

## 2018-01-02 DIAGNOSIS — E7800 Pure hypercholesterolemia, unspecified: Secondary | ICD-10-CM | POA: Diagnosis not present

## 2018-01-07 DIAGNOSIS — E7800 Pure hypercholesterolemia, unspecified: Secondary | ICD-10-CM | POA: Diagnosis not present

## 2018-01-07 DIAGNOSIS — F5104 Psychophysiologic insomnia: Secondary | ICD-10-CM | POA: Diagnosis not present

## 2018-01-07 DIAGNOSIS — F3342 Major depressive disorder, recurrent, in full remission: Secondary | ICD-10-CM | POA: Diagnosis not present

## 2018-01-07 DIAGNOSIS — M797 Fibromyalgia: Secondary | ICD-10-CM | POA: Diagnosis not present

## 2018-01-07 DIAGNOSIS — E039 Hypothyroidism, unspecified: Secondary | ICD-10-CM | POA: Diagnosis not present

## 2018-01-07 DIAGNOSIS — N183 Chronic kidney disease, stage 3 (moderate): Secondary | ICD-10-CM | POA: Diagnosis not present

## 2018-01-16 DIAGNOSIS — R1310 Dysphagia, unspecified: Secondary | ICD-10-CM | POA: Diagnosis not present

## 2018-01-16 DIAGNOSIS — K760 Fatty (change of) liver, not elsewhere classified: Secondary | ICD-10-CM | POA: Diagnosis not present

## 2018-01-16 DIAGNOSIS — K30 Functional dyspepsia: Secondary | ICD-10-CM | POA: Diagnosis not present

## 2018-02-15 DIAGNOSIS — J029 Acute pharyngitis, unspecified: Secondary | ICD-10-CM | POA: Diagnosis not present

## 2018-02-15 DIAGNOSIS — J40 Bronchitis, not specified as acute or chronic: Secondary | ICD-10-CM | POA: Diagnosis not present

## 2018-02-15 DIAGNOSIS — J019 Acute sinusitis, unspecified: Secondary | ICD-10-CM | POA: Diagnosis not present

## 2018-02-28 ENCOUNTER — Other Ambulatory Visit: Payer: Self-pay | Admitting: Internal Medicine

## 2018-03-08 DIAGNOSIS — R0789 Other chest pain: Secondary | ICD-10-CM | POA: Diagnosis not present

## 2018-04-18 ENCOUNTER — Other Ambulatory Visit: Payer: Self-pay | Admitting: Internal Medicine

## 2018-04-18 DIAGNOSIS — Z1231 Encounter for screening mammogram for malignant neoplasm of breast: Secondary | ICD-10-CM

## 2018-05-24 ENCOUNTER — Ambulatory Visit
Admission: RE | Admit: 2018-05-24 | Discharge: 2018-05-24 | Disposition: A | Discharge status: Home / Self Care | Payer: 59 | Source: Ambulatory Visit | Attending: Internal Medicine | Admitting: Internal Medicine

## 2018-05-24 DIAGNOSIS — Z1231 Encounter for screening mammogram for malignant neoplasm of breast: Secondary | ICD-10-CM | POA: Insufficient documentation

## 2019-05-15 ENCOUNTER — Other Ambulatory Visit: Payer: Self-pay | Admitting: Internal Medicine

## 2019-05-15 DIAGNOSIS — Z1231 Encounter for screening mammogram for malignant neoplasm of breast: Secondary | ICD-10-CM

## 2019-06-18 ENCOUNTER — Ambulatory Visit
Admission: RE | Admit: 2019-06-18 | Discharge: 2019-06-18 | Disposition: A | Discharge status: Home / Self Care | Payer: Medicare Other | Source: Ambulatory Visit | Attending: Internal Medicine | Admitting: Internal Medicine

## 2019-06-18 DIAGNOSIS — Z1231 Encounter for screening mammogram for malignant neoplasm of breast: Secondary | ICD-10-CM | POA: Insufficient documentation

## 2019-12-04 ENCOUNTER — Ambulatory Visit: Payer: Medicare Other | Attending: Internal Medicine

## 2019-12-04 DIAGNOSIS — Z23 Encounter for immunization: Secondary | ICD-10-CM | POA: Insufficient documentation

## 2019-12-04 NOTE — Progress Notes (Signed)
   Covid-19 Vaccination Clinic  Name:  Ann Parker    MRN: [id number] DOB: 03-23-1951  12/04/2019  [name] was observed post Covid-19 immunization for 15 minutes without incidence. She was provided with Vaccine Information Sheet and instruction to access the V-Safe system.   [name] was instructed to call 911 with any severe reactions post vaccine: Marland Kitchen Difficulty breathing  . Swelling of your face and throat  . A fast heartbeat  . A bad rash all over your body  . Dizziness and weakness    Immunizations Administered    Name Date Dose VIS Date Route   Pfizer COVID-19 Vaccine 12/04/2019  8:34 AM 0.3 mL 09/19/2019 Intramuscular   Manufacturer: [place]   Lot: [id number]   [place]: [id number]

## 2019-12-31 ENCOUNTER — Ambulatory Visit: Payer: Medicare Other | Attending: Internal Medicine

## 2019-12-31 DIAGNOSIS — Z23 Encounter for immunization: Secondary | ICD-10-CM

## 2019-12-31 NOTE — Progress Notes (Signed)
   Covid-19 Vaccination Clinic  Name:  Ann Parker    MRN: [id number] DOB: May 31, 1951  12/31/2019  Ann Parker was observed post Covid-19 immunization for 15 minutes without incident. She was provided with Vaccine Information Sheet and instruction to access the V-Safe system.   Ann Parker was instructed to call 911 with any severe reactions post vaccine: Marland Kitchen Difficulty breathing  . Swelling of face and throat  . A fast heartbeat  . A bad rash all over body  . Dizziness and weakness   Immunizations Administered    Name Date Dose VIS Date Route   Pfizer COVID-19 Vaccine 12/31/2019  8:21 AM 0.3 mL 09/19/2019 Intramuscular   Manufacturer: [place]   Lot: [id number]   [place]: [id number]

## 2020-02-10 ENCOUNTER — Other Ambulatory Visit: Payer: Self-pay

## 2020-02-10 ENCOUNTER — Emergency Department: Payer: Medicare Other

## 2020-02-10 ENCOUNTER — Emergency Department
Admission: EM | Admit: 2020-02-10 | Discharge: 2020-02-10 | Disposition: A | Discharge status: Home / Self Care | Payer: Medicare Other | Attending: Emergency Medicine | Admitting: Emergency Medicine

## 2020-02-10 DIAGNOSIS — Z923 Personal history of irradiation: Secondary | ICD-10-CM | POA: Diagnosis not present

## 2020-02-10 DIAGNOSIS — Z951 Presence of aortocoronary bypass graft: Secondary | ICD-10-CM | POA: Diagnosis not present

## 2020-02-10 DIAGNOSIS — R1032 Left lower quadrant pain: Secondary | ICD-10-CM | POA: Diagnosis not present

## 2020-02-10 DIAGNOSIS — Z79899 Other long term (current) drug therapy: Secondary | ICD-10-CM | POA: Diagnosis not present

## 2020-02-10 DIAGNOSIS — Z853 Personal history of malignant neoplasm of breast: Secondary | ICD-10-CM | POA: Diagnosis not present

## 2020-02-10 DIAGNOSIS — Z9221 Personal history of antineoplastic chemotherapy: Secondary | ICD-10-CM | POA: Diagnosis not present

## 2020-02-10 DIAGNOSIS — R1031 Right lower quadrant pain: Secondary | ICD-10-CM | POA: Diagnosis present

## 2020-02-10 DIAGNOSIS — E039 Hypothyroidism, unspecified: Secondary | ICD-10-CM | POA: Insufficient documentation

## 2020-02-10 DIAGNOSIS — Z87891 Personal history of nicotine dependence: Secondary | ICD-10-CM | POA: Insufficient documentation

## 2020-02-10 DIAGNOSIS — K5792 Diverticulitis of intestine, part unspecified, without perforation or abscess without bleeding: Secondary | ICD-10-CM | POA: Diagnosis not present

## 2020-02-10 LAB — URINALYSIS, COMPLETE (UACMP) WITH MICROSCOPIC
Bacteria, UA: NONE SEEN
Bilirubin Urine: NEGATIVE
Glucose, UA: NEGATIVE mg/dL
Hgb urine dipstick: NEGATIVE
Ketones, ur: NEGATIVE mg/dL
Nitrite: NEGATIVE
Protein, ur: NEGATIVE mg/dL
Specific Gravity, Urine: 1.019 (ref 1.005–1.030)
pH: 5 (ref 5.0–8.0)

## 2020-02-10 LAB — COMPREHENSIVE METABOLIC PANEL
ALT: 17 U/L (ref 0–44)
AST: 22 U/L (ref 15–41)
Albumin: 3.8 g/dL (ref 3.5–5.0)
Alkaline Phosphatase: 83 U/L (ref 38–126)
Anion gap: 6 (ref 5–15)
BUN: 21 mg/dL (ref 8–23)
CO2: 27 mmol/L (ref 22–32)
Calcium: 8.9 mg/dL (ref 8.9–10.3)
Chloride: 107 mmol/L (ref 98–111)
Creatinine, Ser: 1.1 mg/dL — ABNORMAL HIGH (ref 0.44–1.00)
GFR calc Af Amer: 58 mL/min — ABNORMAL LOW (ref >60)
GFR calc non Af Amer: 50 mL/min — ABNORMAL LOW (ref >60)
Glucose, Bld: 85 mg/dL (ref 70–99)
Potassium: 4.1 mmol/L (ref 3.5–5.1)
Sodium: 140 mmol/L (ref 135–145)
Total Bilirubin: 0.4 mg/dL (ref 0.3–1.2)
Total Protein: 7.1 g/dL (ref 6.5–8.1)

## 2020-02-10 LAB — CBC
HCT: 37.4 % (ref 36.0–46.0)
Hemoglobin: 12.4 g/dL (ref 12.0–15.0)
MCH: 26.3 pg (ref 26.0–34.0)
MCHC: 33.2 g/dL (ref 30.0–36.0)
MCV: 79.2 fL — ABNORMAL LOW (ref 80.0–100.0)
Platelets: 147 10*3/uL — ABNORMAL LOW (ref 150–400)
RBC: 4.72 MIL/uL (ref 3.87–5.11)
RDW: 15.3 % (ref 11.5–15.5)
WBC: 7.7 10*3/uL (ref 4.0–10.5)
nRBC: 0 % (ref 0.0–0.2)

## 2020-02-10 LAB — LIPASE, BLOOD: Lipase: 58 U/L — ABNORMAL HIGH (ref 11–51)

## 2020-02-10 MED ORDER — OXYCODONE-ACETAMINOPHEN 5-325 MG PO TABS
1.0000 | ORAL_TABLET | Freq: Once | ORAL | Status: AC
  Administered 2020-02-10: 19:00:00 1 via ORAL
  Filled 2020-02-10: qty 1

## 2020-02-10 MED ORDER — IOHEXOL 300 MG/ML  SOLN
100.0000 mL | Freq: Once | INTRAMUSCULAR | Status: AC | PRN
  Administered 2020-02-10: 18:00:00 100 mL via INTRAVENOUS

## 2020-02-10 MED ORDER — ONDANSETRON 8 MG PO TBDP: 8.0000 mg | ORAL_TABLET | Freq: Three times a day (TID) | ORAL | 0 refills | Status: DC | PRN

## 2020-02-10 MED ORDER — IOHEXOL 9 MG/ML PO SOLN
500.0000 mL | Freq: Once | ORAL | Status: AC
  Administered 2020-02-10: 18:00:00 1000 mL via ORAL

## 2020-02-10 MED ORDER — TRAMADOL HCL 50 MG PO TABS: 50.0000 mg | ORAL_TABLET | Freq: Four times a day (QID) | ORAL | 0 refills | Status: AC | PRN

## 2020-02-10 MED ORDER — AMOXICILLIN-POT CLAVULANATE 875-125 MG PO TABS
1.0000 | ORAL_TABLET | Freq: Two times a day (BID) | ORAL | 0 refills | Status: AC
Start: 2020-02-10 — End: 2020-02-24

## 2020-02-10 MED ORDER — SODIUM CHLORIDE 0.9% FLUSH: 3.0000 mL | Freq: Once | INTRAVENOUS | Status: DC

## 2020-02-10 MED ORDER — AMOXICILLIN-POT CLAVULANATE 875-125 MG PO TABS
1.0000 | ORAL_TABLET | Freq: Once | ORAL | Status: AC
  Administered 2020-02-10: 20:00:00 1 via ORAL
  Filled 2020-02-10: qty 1

## 2020-02-10 NOTE — ED Triage Notes (Signed)
Pt comes via POV from home with c/o lower abdominal pain that started a week ago. Pt states some diarrhea.  Pt denies any N/V. Pt states pressure when she urinates.

## 2020-02-10 NOTE — Discharge Instructions (Signed)
Take the antibiotic as prescribed and finish the full 14-day course.  You may take the Zofran as needed for nausea, and the tramadol as needed for pain that is not relieved by Tylenol.  Return to the ER for new, worsening, or persistent severe pain, vomiting, fever, blood in the stool, weakness, or any other new or worsening symptoms that concern you.

## 2020-02-10 NOTE — ED Notes (Signed)
EDP at bedside,  

## 2020-02-10 NOTE — ED Notes (Signed)
Pt up to the restroom at this time.

## 2020-02-10 NOTE — ED Provider Notes (Signed)
[facility] Emergency Department Provider Note ____________________________________________   First MD Initiated Contact with Patient 02/10/20 1740     (approximate)  I have reviewed the triage vital signs and the nursing notes.   HISTORY  Chief Complaint Abdominal Pain    HPI TEDI DENKINS is a 69 y.o. female with PMH as noted below who presents with bilateral lower quadrant abdominal pain over the last week, gradual onset, somewhat crampy, and associated with diarrhea.  The patient states that she may have seen a small amount of blood, but is not sure.  She reports some subjective fever, but denies nausea or vomiting.  She has no urinary symptoms.  The patient was diagnosed with diverticulitis earlier this year and took a course of Augmentin.  She states that the pain is similar.  Past Medical History:  Diagnosis Date  . Anxiety   . Bilateral anterior knee pain   . Breast cancer ([place]) 2000   left lumpectomy  . CHF (congestive heart failure) ([place])   . CHF (congestive heart failure) ([place])   . Chronic insomnia   . Depression   . Dysphagia   . Fibromyalgia   . GERD (gastroesophageal reflux disease)   . Hyperlipidemia   . Hypothyroidism   . Obesity, Class III, BMI 40-49.9 (morbid obesity) ([place])   . Pericardial effusion   . Personal history of chemotherapy 2000   left breast cancer  . Personal history of radiation therapy 2000   left breast cancer  . Precordial pain   . SOB (shortness of breath)   . Urinary incontinence in female     Patient Active Problem List   Diagnosis Date Noted  . Chronic insomnia 12/16/2016  . Bilateral anterior knee pain 08/22/2016  . Depression, major, single episode, complete remission ([place]) 06/15/2016  . Dysphagia, unspecified 06/15/2016  . Urinary incontinence in female 06/15/2016  . Body mass index (BMI) of 40.1-44.9 in adult ([place]) 12/17/2015  . Chronic systolic CHF (congestive heart failure), NYHA class 3  ([place]) 08/11/2015  . Mixed hyperlipidemia 08/11/2015  . Pericardial effusion 08/11/2015  . Acquired hypothyroidism 08/03/2015  . Anxiety 08/03/2015  . Fibromyalgia 08/03/2015  . Gastroesophageal reflux disease without esophagitis 08/03/2015    Past Surgical History:  Procedure Laterality Date  . BREAST BIOPSY Left 2000   Positive  . BREAST LUMPECTOMY Left 2000  . BREAST LUMPECTOMY WITH AXILLARY LYMPH NODE DISSECTION Left 2000  . BREAST SURGERY    . CORONARY ARTERY BYPASS GRAFT    . ESOPHAGOGASTRODUODENOSCOPY (EGD) WITH PROPOFOL N/A 01/08/2017   Procedure: ESOPHAGOGASTRODUODENOSCOPY (EGD) WITH PROPOFOL;  Surgeon: Manya Silvas, MD;  Location: [facility] ENDOSCOPY;  Service: Endoscopy;  Laterality: N/A;  . NECK SURGERY      Prior to Admission medications   Medication Sig Start Date End Date Taking? Authorizing Provider  albuterol (PROVENTIL HFA;VENTOLIN HFA) 108 (90 Base) MCG/ACT inhaler Inhale into the lungs every 6 (six) hours as needed for wheezing or shortness of breath.    [provider]  amoxicillin-clavulanate (AUGMENTIN) 875-125 MG tablet Take 1 tablet by mouth 2 (two) times daily for 14 days. 02/10/20 02/24/20  Arta Silence, MD  budesonide-formoterol (SYMBICORT) 80-4.5 MCG/ACT inhaler Inhale 2 puffs into the lungs 2 (two) times daily.    [provider]  citalopram (CELEXA) 20 MG tablet Take 20 mg by mouth daily.    [provider]  ENTRESTO 24-26 MG Take 1 tablet by mouth 2 (two) times daily. 01/26/20   [provider]  ezetimibe (ZETIA) 10 MG tablet Take 10 mg by mouth daily. 01/27/20   [provider]  furosemide (LASIX) 40 MG tablet Take 40 mg by mouth daily.    [provider]  metoprolol succinate (TOPROL-XL) 50 MG 24 hr tablet Take 50 mg by mouth daily. Take with or immediately following a meal.    [provider]  ondansetron (ZOFRAN ODT) 8 MG disintegrating tablet Take 1 tablet (8 mg total) by mouth every 8  (eight) hours as needed. 02/10/20   Arta Silence, MD  pantoprazole (PROTONIX) 40 MG tablet Take 40 mg by mouth daily.    [provider]  potassium chloride (K-DUR,KLOR-CON) 10 MEQ tablet Take 10 mEq by mouth 2 (two) times daily.    [provider]  pregabalin (LYRICA) 150 MG capsule Take 150 mg by mouth 3 (three) times daily.    [provider]  traMADol (ULTRAM) 50 MG tablet Take 1 tablet (50 mg total) by mouth every 6 (six) hours as needed for up to 5 days. 02/10/20 02/15/20  Arta Silence, MD  traZODone (DESYREL) 50 MG tablet Take 50 mg by mouth at bedtime.    [provider]    Allergies Codeine  Family History  Problem Relation Age of Onset  . Breast cancer Daughter 38    Social History Social History   Tobacco Use  . Smoking status: Former Research scientist (life sciences)  . Smokeless tobacco: Never Used  Substance Use Topics  . Alcohol use: No  . Drug use: No    Review of Systems  Constitutional: Positive for subjective fever. Eyes: No visual changes. ENT: No sore throat. Cardiovascular: Denies chest pain. Respiratory: Denies shortness of breath. Gastrointestinal: No vomiting.  Positive for diarrhea.  Genitourinary: Negative for dysuria.  Musculoskeletal: Negative for back pain. Skin: Negative for rash. Neurological: Negative for headache.   ____________________________________________   PHYSICAL EXAM:  VITAL SIGNS: ED Triage Vitals  Enc Vitals Group     BP 02/10/20 1658 (!) 138/59     Pulse Rate 02/10/20 1658 75     Resp 02/10/20 1658 18     Temp 02/10/20 1658 98.2 F (36.8 C)     Temp Source 02/10/20 1658 Oral     SpO2 02/10/20 1658 98 %     Weight 02/10/20 1659 239 lb (108.4 kg)     Height 02/10/20 1659 5\' 7"  (1.702 m)     Head Circumference --      Peak Flow --      Pain Score 02/10/20 1702 9     Pain Loc --      Pain Edu? --      Excl. in [place]? --     Constitutional: Alert and oriented. Well appearing and in no acute  distress. Eyes: Conjunctivae are normal.  No scleral icterus. Head: Atraumatic. Nose: No congestion/rhinnorhea. Mouth/Throat: Mucous membranes are moist.   Neck: Normal range of motion.  Cardiovascular: Normal rate, regular rhythm. Good peripheral circulation. Respiratory: Normal respiratory effort.  No retractions.  Gastrointestinal: Soft with mild bilateral lower quadrant tenderness no distention.  Genitourinary: No flank tenderness. Musculoskeletal: Extremities warm and well perfused.  Neurologic:  Normal speech and language. No gross focal neurologic deficits are appreciated.  Skin:  Skin is warm and dry. No rash noted. Psychiatric: Mood and affect are normal. Speech and behavior are normal.  ____________________________________________   LABS (all labs ordered are listed, but only abnormal results are displayed)  Labs Reviewed  LIPASE, BLOOD - Abnormal; Notable for the  following components:      Result Value   Lipase 58 (*)    All other components within normal limits  COMPREHENSIVE METABOLIC PANEL - Abnormal; Notable for the following components:   Creatinine, Ser 1.10 (*)    GFR calc non Af Amer 50 (*)    GFR calc Af Amer 58 (*)    All other components within normal limits  CBC - Abnormal; Notable for the following components:   MCV 79.2 (*)    Platelets 147 (*)    All other components within normal limits  URINALYSIS, COMPLETE (UACMP) WITH MICROSCOPIC - Abnormal; Notable for the following components:   Color, Urine YELLOW (*)    APPearance CLEAR (*)    Leukocytes,Ua TRACE (*)    All other components within normal limits   ____________________________________________  EKG   ____________________________________________  RADIOLOGY  CT abdomen: Sigmoid diverticulitis with no abscess or perforation  ____________________________________________   PROCEDURES  Procedure(s) performed: No  Procedures  Critical Care performed:  No ____________________________________________   INITIAL IMPRESSION / ASSESSMENT AND PLAN / ED COURSE  Pertinent labs & imaging results that were available during my care of the patient were reviewed by me and considered in my medical decision making (see chart for details).  68 year old female with PMH as noted above presents with bilateral lower quadrant abdominal pain associated with diarrhea over the last week.  I reviewed the past medical records in [place].  The patient was empirically treated for diverticulitis in February with Augmentin.  She states that the symptoms subsequently completely resolved.  She did not have imaging at that time.  She reports that today symptoms are similar.  On exam, she is overall well-appearing.  Her vital signs are normal.  The abdomen is soft with mild bilateral lower quadrant tenderness.  Differential includes diverticulitis, colitis, viral gastroenteritis, UTI, or musculoskeletal pain.  We will obtain lab work-up, urinalysis, and CT.  ----------------------------------------- 7:15 PM on 02/10/2020 -----------------------------------------  Lab work-up and UA are unremarkable.  The CT shows findings compatible with uncomplicated sigmoid diverticulitis.    At this time, the patient is stable for discharge home.  She has been tolerating p.o. and appears comfortable.  I counseled her on the results of the work-up.  I will start her on a 14-day course of Augmentin, as this worked well during her last bout of this a few months ago.  I gave her thorough return precautions, and she expressed understanding. ____________________________________________   FINAL CLINICAL IMPRESSION(S) / ED DIAGNOSES  Final diagnoses:  Diverticulitis      NEW MEDICATIONS STARTED DURING THIS VISIT:  New Prescriptions   AMOXICILLIN-CLAVULANATE (AUGMENTIN) 875-125 MG TABLET    Take 1 tablet by mouth 2 (two) times daily for 14 days.   ONDANSETRON (ZOFRAN ODT) 8 MG  DISINTEGRATING TABLET    Take 1 tablet (8 mg total) by mouth every 8 (eight) hours as needed.   TRAMADOL (ULTRAM) 50 MG TABLET    Take 1 tablet (50 mg total) by mouth every 6 (six) hours as needed for up to 5 days.     Note:  This document was prepared using Dragon voice recognition software and may include unintentional dictation errors.    Arta Silence, MD 02/10/20 [phone]

## 2020-03-06 ENCOUNTER — Other Ambulatory Visit
Admission: RE | Admit: 2020-03-06 | Discharge: 2020-03-06 | Disposition: A | Discharge status: Home / Self Care | Payer: Medicare Other | Source: Ambulatory Visit | Attending: Student | Admitting: Student

## 2020-03-06 DIAGNOSIS — R197 Diarrhea, unspecified: Secondary | ICD-10-CM | POA: Insufficient documentation

## 2020-03-06 LAB — GASTROINTESTINAL PANEL BY PCR, STOOL (REPLACES STOOL CULTURE)

## 2020-03-06 LAB — C DIFFICILE QUICK SCREEN W PCR REFLEX
C Diff antigen: NEGATIVE
C Diff interpretation: NOT DETECTED
C Diff toxin: NEGATIVE

## 2020-03-11 LAB — CALPROTECTIN, FECAL: Calprotectin, Fecal: 55 ug/g (ref 0–120)

## 2020-06-28 ENCOUNTER — Other Ambulatory Visit: Payer: Self-pay | Admitting: Internal Medicine

## 2020-06-28 DIAGNOSIS — Z1231 Encounter for screening mammogram for malignant neoplasm of breast: Secondary | ICD-10-CM

## 2020-06-30 ENCOUNTER — Ambulatory Visit
Admission: RE | Admit: 2020-06-30 | Discharge: 2020-06-30 | Disposition: A | Discharge status: Home / Self Care | Payer: Medicare Other | Source: Ambulatory Visit | Attending: Internal Medicine | Admitting: Internal Medicine

## 2020-06-30 ENCOUNTER — Other Ambulatory Visit: Payer: Self-pay

## 2020-06-30 DIAGNOSIS — Z1231 Encounter for screening mammogram for malignant neoplasm of breast: Secondary | ICD-10-CM | POA: Diagnosis not present

## 2020-10-22 DIAGNOSIS — I10 Essential (primary) hypertension: Secondary | ICD-10-CM | POA: Diagnosis not present

## 2020-10-22 DIAGNOSIS — E782 Mixed hyperlipidemia: Secondary | ICD-10-CM | POA: Diagnosis not present

## 2020-10-22 DIAGNOSIS — E039 Hypothyroidism, unspecified: Secondary | ICD-10-CM | POA: Diagnosis not present

## 2020-11-02 DIAGNOSIS — F325 Major depressive disorder, single episode, in full remission: Secondary | ICD-10-CM | POA: Diagnosis not present

## 2020-11-02 DIAGNOSIS — Z0001 Encounter for general adult medical examination with abnormal findings: Secondary | ICD-10-CM | POA: Diagnosis not present

## 2020-11-02 DIAGNOSIS — E039 Hypothyroidism, unspecified: Secondary | ICD-10-CM | POA: Diagnosis not present

## 2020-11-02 DIAGNOSIS — N1832 Chronic kidney disease, stage 3b: Secondary | ICD-10-CM | POA: Diagnosis not present

## 2020-11-02 DIAGNOSIS — I129 Hypertensive chronic kidney disease with stage 1 through stage 4 chronic kidney disease, or unspecified chronic kidney disease: Secondary | ICD-10-CM | POA: Diagnosis not present

## 2020-11-02 DIAGNOSIS — Z0000 Encounter for general adult medical examination without abnormal findings: Secondary | ICD-10-CM | POA: Diagnosis not present

## 2020-12-02 ENCOUNTER — Other Ambulatory Visit: Payer: Medicare HMO

## 2020-12-06 ENCOUNTER — Ambulatory Visit: Admit: 2020-12-06 | Payer: Medicare Other | Admitting: Internal Medicine

## 2020-12-06 SURGERY — COLONOSCOPY WITH PROPOFOL
Anesthesia: General

## 2021-02-14 ENCOUNTER — Emergency Department: Payer: Medicare HMO

## 2021-02-14 ENCOUNTER — Emergency Department
Admission: EM | Admit: 2021-02-14 | Discharge: 2021-02-15 | Disposition: A | Discharge status: Home / Self Care | Payer: Medicare HMO | Attending: Emergency Medicine | Admitting: Emergency Medicine

## 2021-02-14 ENCOUNTER — Encounter: Payer: Self-pay | Admitting: *Deleted

## 2021-02-14 ENCOUNTER — Other Ambulatory Visit: Payer: Self-pay

## 2021-02-14 ENCOUNTER — Other Ambulatory Visit: Admission: RE | Admit: 2021-02-14 | Payer: Medicare HMO | Source: Ambulatory Visit

## 2021-02-14 DIAGNOSIS — I5022 Chronic systolic (congestive) heart failure: Secondary | ICD-10-CM | POA: Diagnosis not present

## 2021-02-14 DIAGNOSIS — Z853 Personal history of malignant neoplasm of breast: Secondary | ICD-10-CM | POA: Insufficient documentation

## 2021-02-14 DIAGNOSIS — E039 Hypothyroidism, unspecified: Secondary | ICD-10-CM | POA: Diagnosis not present

## 2021-02-14 DIAGNOSIS — K219 Gastro-esophageal reflux disease without esophagitis: Secondary | ICD-10-CM | POA: Insufficient documentation

## 2021-02-14 DIAGNOSIS — K5732 Diverticulitis of large intestine without perforation or abscess without bleeding: Secondary | ICD-10-CM | POA: Diagnosis not present

## 2021-02-14 DIAGNOSIS — Z955 Presence of coronary angioplasty implant and graft: Secondary | ICD-10-CM | POA: Insufficient documentation

## 2021-02-14 DIAGNOSIS — R1032 Left lower quadrant pain: Secondary | ICD-10-CM | POA: Diagnosis present

## 2021-02-14 DIAGNOSIS — Z87891 Personal history of nicotine dependence: Secondary | ICD-10-CM | POA: Diagnosis not present

## 2021-02-14 DIAGNOSIS — K5792 Diverticulitis of intestine, part unspecified, without perforation or abscess without bleeding: Secondary | ICD-10-CM | POA: Diagnosis not present

## 2021-02-14 DIAGNOSIS — R109 Unspecified abdominal pain: Secondary | ICD-10-CM | POA: Diagnosis not present

## 2021-02-14 LAB — URINALYSIS, COMPLETE (UACMP) WITH MICROSCOPIC
Bilirubin Urine: NEGATIVE
Glucose, UA: NEGATIVE mg/dL
Hgb urine dipstick: NEGATIVE
Ketones, ur: NEGATIVE mg/dL
Nitrite: NEGATIVE
Protein, ur: NEGATIVE mg/dL
Specific Gravity, Urine: 1.021 (ref 1.005–1.030)
pH: 5 (ref 5.0–8.0)

## 2021-02-14 LAB — COMPREHENSIVE METABOLIC PANEL
ALT: 18 U/L (ref 0–44)
AST: 26 U/L (ref 15–41)
Albumin: 3.5 g/dL (ref 3.5–5.0)
Alkaline Phosphatase: 81 U/L (ref 38–126)
Anion gap: 7 (ref 5–15)
BUN: 16 mg/dL (ref 8–23)
CO2: 27 mmol/L (ref 22–32)
Calcium: 8.7 mg/dL — ABNORMAL LOW (ref 8.9–10.3)
Chloride: 105 mmol/L (ref 98–111)
Creatinine, Ser: 0.91 mg/dL (ref 0.44–1.00)
GFR, Estimated: >60 mL/min (ref >60)
Glucose, Bld: 107 mg/dL — ABNORMAL HIGH (ref 70–99)
Potassium: 3.9 mmol/L (ref 3.5–5.1)
Sodium: 139 mmol/L (ref 135–145)
Total Bilirubin: 0.5 mg/dL (ref 0.3–1.2)
Total Protein: 6.7 g/dL (ref 6.5–8.1)

## 2021-02-14 LAB — CBC
HCT: 35.4 % — ABNORMAL LOW (ref 36.0–46.0)
Hemoglobin: 11.8 g/dL — ABNORMAL LOW (ref 12.0–15.0)
MCH: 27.4 pg (ref 26.0–34.0)
MCHC: 33.3 g/dL (ref 30.0–36.0)
MCV: 82.3 fL (ref 80.0–100.0)
Platelets: 117 10*3/uL — ABNORMAL LOW (ref 150–400)
RBC: 4.3 MIL/uL (ref 3.87–5.11)
RDW: 14.6 % (ref 11.5–15.5)
WBC: 10.9 10*3/uL — ABNORMAL HIGH (ref 4.0–10.5)
nRBC: 0 % (ref 0.0–0.2)

## 2021-02-14 LAB — LIPASE, BLOOD: Lipase: 39 U/L (ref 11–51)

## 2021-02-14 MED ORDER — IOHEXOL 300 MG/ML  SOLN
100.0000 mL | Freq: Once | INTRAMUSCULAR | Status: AC | PRN
  Administered 2021-02-14: 100 mL via INTRAVENOUS

## 2021-02-14 MED ORDER — AMOXICILLIN-POT CLAVULANATE 875-125 MG PO TABS: 1.0000 | ORAL_TABLET | Freq: Three times a day (TID) | ORAL | 0 refills | Status: AC

## 2021-02-14 MED ORDER — AMOXICILLIN-POT CLAVULANATE 875-125 MG PO TABS
1.0000 | ORAL_TABLET | Freq: Once | ORAL | Status: AC
  Administered 2021-02-14: 1 via ORAL
  Filled 2021-02-14: qty 1

## 2021-02-14 MED ORDER — FENTANYL CITRATE (PF) 100 MCG/2ML IJ SOLN
50.0000 ug | Freq: Once | INTRAMUSCULAR | Status: AC
  Administered 2021-02-14: 50 ug via INTRAVENOUS
  Filled 2021-02-14: qty 2

## 2021-02-14 MED ORDER — TRAMADOL HCL 50 MG PO TABS: 50.0000 mg | ORAL_TABLET | Freq: Four times a day (QID) | ORAL | 0 refills | Status: AC | PRN

## 2021-02-14 NOTE — ED Provider Notes (Signed)
[facility] Emergency Department Provider Note   ____________________________________________   I have reviewed the triage vital signs and the nursing notes.   HISTORY  Chief Complaint Abdominal Pain   History limited by: Not Limited   HPI Ann Parker is a 70 y.o. female who presents to the emergency department today because of concern for abdominal pain. Located in the left abdomen and left lower quadrant. Patient states she has had some discomfort in that area for over one month. Was treated with a course of oral antibiotics roughly a month ago for diverticulitis but did not feel like it completely cleared the pain. For the past three days the pain has become more severe. It is worse with movement. The patient has had associated diarrhea. The patient has not had any fevers.    Records reviewed. Per medical record review patient has a history of ER visit roughly 1 year ago for abdominal pain. Imaging at that time showed diverticulitis.   Past Medical History:  Diagnosis Date  . Anxiety   . Bilateral anterior knee pain   . Breast cancer ([place]) 2000   left lumpectomy  . CHF (congestive heart failure) ([place])   . CHF (congestive heart failure) ([place])   . Chronic insomnia   . Depression   . Dysphagia   . Fibromyalgia   . GERD (gastroesophageal reflux disease)   . Hyperlipidemia   . Hypothyroidism   . Obesity, Class III, BMI 40-49.9 (morbid obesity) ([place])   . Pericardial effusion   . Personal history of chemotherapy 2000   left breast cancer  . Personal history of radiation therapy 2000   left breast cancer  . Precordial pain   . SOB (shortness of breath)   . Urinary incontinence in female     Patient Active Problem List   Diagnosis Date Noted  . Chronic insomnia 12/16/2016  . Bilateral anterior knee pain 08/22/2016  . Depression, major, single episode, complete remission ([place]) 06/15/2016  . Dysphagia, unspecified 06/15/2016  . Urinary  incontinence in female 06/15/2016  . Body mass index (BMI) of 40.1-44.9 in adult ([place]) 12/17/2015  . Chronic systolic CHF (congestive heart failure), NYHA class 3 ([place]) 08/11/2015  . Mixed hyperlipidemia 08/11/2015  . Pericardial effusion 08/11/2015  . Acquired hypothyroidism 08/03/2015  . Anxiety 08/03/2015  . Fibromyalgia 08/03/2015  . Gastroesophageal reflux disease without esophagitis 08/03/2015    Past Surgical History:  Procedure Laterality Date  . BREAST BIOPSY Left 2000   Positive  . BREAST LUMPECTOMY Left 2000  . BREAST LUMPECTOMY WITH AXILLARY LYMPH NODE DISSECTION Left 2000  . BREAST SURGERY    . CORONARY ARTERY BYPASS GRAFT    . ESOPHAGOGASTRODUODENOSCOPY (EGD) WITH PROPOFOL N/A 01/08/2017   Procedure: ESOPHAGOGASTRODUODENOSCOPY (EGD) WITH PROPOFOL;  Surgeon: Manya Silvas, MD;  Location: [facility] ENDOSCOPY;  Service: Endoscopy;  Laterality: N/A;  . NECK SURGERY      Prior to Admission medications   Medication Sig Start Date End Date Taking? Authorizing Provider  albuterol (PROVENTIL HFA;VENTOLIN HFA) 108 (90 Base) MCG/ACT inhaler Inhale into the lungs every 6 (six) hours as needed for wheezing or shortness of breath.    [provider]  budesonide-formoterol (SYMBICORT) 80-4.5 MCG/ACT inhaler Inhale 2 puffs into the lungs 2 (two) times daily.    [provider]  citalopram (CELEXA) 20 MG tablet Take 20 mg by mouth daily.    [provider]  ENTRESTO 24-26 MG Take 1 tablet by mouth 2 (two) times daily. 01/26/20  [provider]  ezetimibe (ZETIA) 10 MG tablet Take 10 mg by mouth daily. 01/27/20   [provider]  furosemide (LASIX) 40 MG tablet Take 40 mg by mouth daily.    [provider]  metoprolol succinate (TOPROL-XL) 50 MG 24 hr tablet Take 50 mg by mouth daily. Take with or immediately following a meal.    [provider]  ondansetron (ZOFRAN ODT) 8 MG disintegrating tablet Take 1 tablet (8 mg total) by  mouth every 8 (eight) hours as needed. 02/10/20   Arta Silence, MD  pantoprazole (PROTONIX) 40 MG tablet Take 40 mg by mouth daily.    [provider]  potassium chloride (K-DUR,KLOR-CON) 10 MEQ tablet Take 10 mEq by mouth 2 (two) times daily.    [provider]  pregabalin (LYRICA) 150 MG capsule Take 150 mg by mouth 3 (three) times daily.    [provider]  traZODone (DESYREL) 50 MG tablet Take 50 mg by mouth at bedtime.    [provider]    Allergies Codeine  Family History  Problem Relation Age of Onset  . Breast cancer Daughter 71    Social History Social History   Tobacco Use  . Smoking status: Former Research scientist (life sciences)  . Smokeless tobacco: Never Used  Substance Use Topics  . Alcohol use: No  . Drug use: No    Review of Systems Constitutional: No fever/chills Eyes: No visual changes. ENT: No sore throat. Cardiovascular: Denies chest pain. Respiratory: Denies shortness of breath. Gastrointestinal: No abdominal pain.  No nausea, no vomiting.  No diarrhea.   Genitourinary: Negative for dysuria. Musculoskeletal: Negative for back pain. Skin: Negative for rash. Neurological: Negative for headaches, focal weakness or numbness.  ____________________________________________   PHYSICAL EXAM:  VITAL SIGNS: ED Triage Vitals  Enc Vitals Group     BP 02/14/21 1905 116/64     Pulse Rate 02/14/21 1905 80     Resp 02/14/21 1905 20     Temp 02/14/21 1905 99 F (37.2 C)     Temp Source 02/14/21 1905 Oral     SpO2 02/14/21 1905 96 %     Weight 02/14/21 1901 235 lb (106.6 kg)     Height 02/14/21 1901 5\' 7"  (1.702 m)     Head Circumference --      Peak Flow --      Pain Score 02/14/21 1901 10     Pain Loc --      Pain Edu? --      Excl. in [place]? --      Constitutional: Alert and oriented.  Eyes: Conjunctivae are normal.  ENT      Head: Normocephalic and atraumatic.      Nose: No congestion/rhinnorhea.      Mouth/Throat: Mucous  membranes are moist.      Neck: No stridor. Hematological/Lymphatic/Immunilogical: No cervical lymphadenopathy. Cardiovascular: Normal rate, regular rhythm.  No murmurs, rubs, or gallops.  Respiratory: Normal respiratory effort without tachypnea nor retractions. Breath sounds are clear and equal bilaterally. No wheezes/rales/rhonchi. Gastrointestinal: Soft and tender to palpation in the left lower quadrant Genitourinary: Deferred Musculoskeletal: Normal range of motion in all extremities. No lower extremity edema. Neurologic:  Normal speech and language. No gross focal neurologic deficits are appreciated.  Skin:  Skin is warm, dry and intact. No rash noted. Psychiatric: Mood and affect are normal. Speech and behavior are normal. Patient exhibits appropriate insight and judgment.  ____________________________________________    LABS (pertinent positives/negatives)  Lipase 39 CBC wbc  10.9, hgb 11.8, plt 117 CMP wnl except glu 107, ca 8.7  ____________________________________________   EKG  None  ____________________________________________    RADIOLOGY  CT abd/pel Diverticulitis without perforation or abscess  ____________________________________________   PROCEDURES  Procedures  ____________________________________________   INITIAL IMPRESSION / ASSESSMENT AND PLAN / ED COURSE  Pertinent labs & imaging results that were available during my care of the patient were reviewed by me and considered in my medical decision making (see chart for details).   Patient presented to the emergency department today because of concerns for left lower abdominal pain.  CT scan here does show diverticulitis without perforation or abscess.  There is very mild leukocytosis.  At this time do not feel patient requires inpatient admission.  I discussed with patient plan for outpatient care with antibiotics and pain medication.  ____________________________________________   FINAL  CLINICAL IMPRESSION(S) / ED DIAGNOSES  Final diagnoses:  Diverticulitis     Note: This dictation was prepared with Dragon dictation. Any transcriptional errors that result from this process are unintentional     Nance Pear, MD 02/14/21 2336

## 2021-02-14 NOTE — ED Triage Notes (Signed)
Pt has left side abd pain.  Sx for 4 days.  Pt has diarrhea.  No vomiting.  Hx diverticulitis.  Pt alert  Speech clear.

## 2021-02-14 NOTE — Discharge Instructions (Signed)
Please seek medical attention for any high fevers, chest pain, shortness of breath, change in behavior, persistent vomiting, bloody stool or any other new or concerning symptoms.  

## 2021-02-16 ENCOUNTER — Encounter: Admission: RE | Payer: Self-pay | Source: Home / Self Care

## 2021-02-16 ENCOUNTER — Ambulatory Visit: Admission: RE | Admit: 2021-02-16 | Payer: Medicare HMO | Source: Home / Self Care | Admitting: Internal Medicine

## 2021-02-16 SURGERY — COLONOSCOPY WITH PROPOFOL
Anesthesia: General

## 2021-03-08 ENCOUNTER — Emergency Department
Admission: EM | Admit: 2021-03-08 | Discharge: 2021-03-08 | Disposition: A | Discharge status: Home / Self Care | Payer: Medicare HMO | Attending: Emergency Medicine | Admitting: Emergency Medicine

## 2021-03-08 ENCOUNTER — Emergency Department: Payer: Medicare HMO

## 2021-03-08 ENCOUNTER — Other Ambulatory Visit: Payer: Self-pay

## 2021-03-08 DIAGNOSIS — I110 Hypertensive heart disease with heart failure: Secondary | ICD-10-CM | POA: Insufficient documentation

## 2021-03-08 DIAGNOSIS — Z853 Personal history of malignant neoplasm of breast: Secondary | ICD-10-CM | POA: Diagnosis not present

## 2021-03-08 DIAGNOSIS — Z79899 Other long term (current) drug therapy: Secondary | ICD-10-CM | POA: Insufficient documentation

## 2021-03-08 DIAGNOSIS — R519 Headache, unspecified: Secondary | ICD-10-CM | POA: Diagnosis not present

## 2021-03-08 DIAGNOSIS — H8111 Benign paroxysmal vertigo, right ear: Secondary | ICD-10-CM | POA: Diagnosis not present

## 2021-03-08 DIAGNOSIS — R42 Dizziness and giddiness: Secondary | ICD-10-CM | POA: Diagnosis not present

## 2021-03-08 DIAGNOSIS — W19XXXA Unspecified fall, initial encounter: Secondary | ICD-10-CM | POA: Diagnosis not present

## 2021-03-08 DIAGNOSIS — R404 Transient alteration of awareness: Secondary | ICD-10-CM | POA: Diagnosis not present

## 2021-03-08 DIAGNOSIS — I10 Essential (primary) hypertension: Secondary | ICD-10-CM | POA: Diagnosis not present

## 2021-03-08 DIAGNOSIS — R110 Nausea: Secondary | ICD-10-CM | POA: Diagnosis not present

## 2021-03-08 DIAGNOSIS — S199XXA Unspecified injury of neck, initial encounter: Secondary | ICD-10-CM | POA: Diagnosis not present

## 2021-03-08 DIAGNOSIS — E039 Hypothyroidism, unspecified: Secondary | ICD-10-CM | POA: Diagnosis not present

## 2021-03-08 DIAGNOSIS — Z87891 Personal history of nicotine dependence: Secondary | ICD-10-CM | POA: Diagnosis not present

## 2021-03-08 DIAGNOSIS — Z743 Need for continuous supervision: Secondary | ICD-10-CM | POA: Diagnosis not present

## 2021-03-08 DIAGNOSIS — I5022 Chronic systolic (congestive) heart failure: Secondary | ICD-10-CM | POA: Insufficient documentation

## 2021-03-08 LAB — CBC
HCT: 36.9 % (ref 36.0–46.0)
Hemoglobin: 12.7 g/dL (ref 12.0–15.0)
MCH: 27.5 pg (ref 26.0–34.0)
MCHC: 34.4 g/dL (ref 30.0–36.0)
MCV: 79.9 fL — ABNORMAL LOW (ref 80.0–100.0)
Platelets: 127 10*3/uL — ABNORMAL LOW (ref 150–400)
RBC: 4.62 MIL/uL (ref 3.87–5.11)
RDW: 15 % (ref 11.5–15.5)
WBC: 7.2 10*3/uL (ref 4.0–10.5)
nRBC: 0 % (ref 0.0–0.2)

## 2021-03-08 LAB — BASIC METABOLIC PANEL
Anion gap: 7 (ref 5–15)
BUN: 24 mg/dL — ABNORMAL HIGH (ref 8–23)
CO2: 25 mmol/L (ref 22–32)
Calcium: 8.9 mg/dL (ref 8.9–10.3)
Chloride: 108 mmol/L (ref 98–111)
Creatinine, Ser: 0.91 mg/dL (ref 0.44–1.00)
GFR, Estimated: >60 mL/min (ref >60)
Glucose, Bld: 126 mg/dL — ABNORMAL HIGH (ref 70–99)
Potassium: 4.1 mmol/L (ref 3.5–5.1)
Sodium: 140 mmol/L (ref 135–145)

## 2021-03-08 MED ORDER — MECLIZINE HCL 25 MG PO TABS: 25.0000 mg | ORAL_TABLET | Freq: Three times a day (TID) | ORAL | 0 refills | Status: DC | PRN

## 2021-03-08 MED ORDER — ACETAMINOPHEN 500 MG PO TABS
1000.0000 mg | ORAL_TABLET | Freq: Once | ORAL | Status: AC
  Administered 2021-03-08: 1000 mg via ORAL
  Filled 2021-03-08: qty 2

## 2021-03-08 MED ORDER — MECLIZINE HCL 25 MG PO TABS
25.0000 mg | ORAL_TABLET | Freq: Once | ORAL | Status: AC
  Administered 2021-03-08: 25 mg via ORAL
  Filled 2021-03-08: qty 1

## 2021-03-08 NOTE — ED Provider Notes (Signed)
[facility] Emergency Department Provider Note ____________________________________________   Event Date/Time   First MD Initiated Contact with Patient 03/08/21 1107     (approximate)  I have reviewed the triage vital signs and the nursing notes.  HISTORY  Chief Complaint Dizziness   HPI Ann Parker is a 70 y.o. femalewho presents to the ED for evaluation of posttraumatic dizziness.  Chart review indicates history of obesity, HTN, HLD, GERD and CHF.  Patient presents with her daughter, who provides some additional history, for evaluation of intermittent dizziness over the past 3 days.  Patient reports an accidental mechanical fall that occurred on Saturday evening, causing her to strike her forehead on a nearby nightstand.  Denies syncope or additional trauma associated with this fall.  Since that time, she reports intermittent dizziness and nausea without emesis.  She reports any time she changes positions quickly, especially when she turns over onto her right side in bed, she develops vertigo dizziness.  She reports it feels like she is drunk, but adamantly denies drinking alcohol or using recreational drugs.  Denies any subsequent syncopal episodes.  Reports a headache for which she has not taken any medications.  She reports feeling better when she sits very still and keeps her eyes closed.  Reports her dizziness caused her to fall again on Sunday, 2 days ago, but she reports catching herself and no trauma from this.  Past Medical History:  Diagnosis Date  . Anxiety   . Bilateral anterior knee pain   . Breast cancer ([place]) 2000   left lumpectomy  . CHF (congestive heart failure) ([place])   . CHF (congestive heart failure) ([place])   . Chronic insomnia   . Depression   . Dysphagia   . Fibromyalgia   . GERD (gastroesophageal reflux disease)   . Hyperlipidemia   . Hypothyroidism   . Obesity, Class III, BMI 40-49.9 (morbid obesity) ([place])   . Pericardial  effusion   . Personal history of chemotherapy 2000   left breast cancer  . Personal history of radiation therapy 2000   left breast cancer  . Precordial pain   . SOB (shortness of breath)   . Urinary incontinence in female     Patient Active Problem List   Diagnosis Date Noted  . Chronic insomnia 12/16/2016  . Bilateral anterior knee pain 08/22/2016  . Depression, major, single episode, complete remission ([place]) 06/15/2016  . Dysphagia, unspecified 06/15/2016  . Urinary incontinence in female 06/15/2016  . Body mass index (BMI) of 40.1-44.9 in adult ([place]) 12/17/2015  . Chronic systolic CHF (congestive heart failure), NYHA class 3 ([place]) 08/11/2015  . Mixed hyperlipidemia 08/11/2015  . Pericardial effusion 08/11/2015  . Acquired hypothyroidism 08/03/2015  . Anxiety 08/03/2015  . Fibromyalgia 08/03/2015  . Gastroesophageal reflux disease without esophagitis 08/03/2015    Past Surgical History:  Procedure Laterality Date  . BREAST BIOPSY Left 2000   Positive  . BREAST LUMPECTOMY Left 2000  . BREAST LUMPECTOMY WITH AXILLARY LYMPH NODE DISSECTION Left 2000  . BREAST SURGERY    . CORONARY ARTERY BYPASS GRAFT    . ESOPHAGOGASTRODUODENOSCOPY (EGD) WITH PROPOFOL N/A 01/08/2017   Procedure: ESOPHAGOGASTRODUODENOSCOPY (EGD) WITH PROPOFOL;  Surgeon: Manya Silvas, MD;  Location: [facility] ENDOSCOPY;  Service: Endoscopy;  Laterality: N/A;  . NECK SURGERY      Prior to Admission medications   Medication Sig Start Date End Date Taking? Authorizing Provider  meclizine (ANTIVERT) 25 MG tablet Take 1 tablet (25 mg total) by  mouth 3 (three) times daily as needed for dizziness. 03/08/21  Yes Vladimir Crofts, MD  albuterol (PROVENTIL HFA;VENTOLIN HFA) 108 (90 Base) MCG/ACT inhaler Inhale into the lungs every 6 (six) hours as needed for wheezing or shortness of breath.    [provider]  budesonide-formoterol (SYMBICORT) 80-4.5 MCG/ACT inhaler Inhale 2 puffs into the lungs 2 (two) times daily.     [provider]  citalopram (CELEXA) 20 MG tablet Take 20 mg by mouth daily.    [provider]  ENTRESTO 24-26 MG Take 1 tablet by mouth 2 (two) times daily. 01/26/20   [provider]  ezetimibe (ZETIA) 10 MG tablet Take 10 mg by mouth daily. 01/27/20   [provider]  furosemide (LASIX) 40 MG tablet Take 40 mg by mouth daily.    [provider]  metoprolol succinate (TOPROL-XL) 50 MG 24 hr tablet Take 50 mg by mouth daily. Take with or immediately following a meal.    [provider]  ondansetron (ZOFRAN ODT) 8 MG disintegrating tablet Take 1 tablet (8 mg total) by mouth every 8 (eight) hours as needed. 02/10/20   Arta Silence, MD  pantoprazole (PROTONIX) 40 MG tablet Take 40 mg by mouth daily.    [provider]  potassium chloride (K-DUR,KLOR-CON) 10 MEQ tablet Take 10 mEq by mouth 2 (two) times daily.    [provider]  pregabalin (LYRICA) 150 MG capsule Take 150 mg by mouth 3 (three) times daily.    [provider]  traMADol (ULTRAM) 50 MG tablet Take 1 tablet (50 mg total) by mouth every 6 (six) hours as needed. 02/14/21 02/14/22  Nance Pear, MD  traZODone (DESYREL) 50 MG tablet Take 50 mg by mouth at bedtime.    [provider]    Allergies Codeine  Family History  Problem Relation Age of Onset  . Breast cancer Daughter 80    Social History Social History   Tobacco Use  . Smoking status: Former Research scientist (life sciences)  . Smokeless tobacco: Never Used  Substance Use Topics  . Alcohol use: No  . Drug use: No    Review of Systems  Constitutional: No fever/chills Eyes: No visual changes. ENT: No sore throat. Cardiovascular: Denies chest pain. Respiratory: Denies shortness of breath. Gastrointestinal: No abdominal pain.  No nausea, no vomiting.  No diarrhea.  No constipation. Genitourinary: Negative for dysuria. Musculoskeletal: Negative for back pain. Skin: Negative for  rash. Neurological: Negative for focal weakness or numbness.  Positive for headache and vertigo  ____________________________________________   PHYSICAL EXAM:  VITAL SIGNS: Vitals:   03/08/21 1047  BP: 128/77  Pulse: 73  Resp: 18  Temp: 98 F (36.7 C)  SpO2: 97%    Constitutional: Alert and oriented. Well appearing and in no acute distress. Eyes: Conjunctivae are normal. PERRL. EOMI. Rightward horizontal nystagmus on EOM, does exacerbate her symptoms. Head: Atraumatic. Nose: No congestion/rhinnorhea. Mouth/Throat: Mucous membranes are moist.  Oropharynx non-erythematous. Neck: No stridor. No cervical spine tenderness to palpation. Cardiovascular: Normal rate, regular rhythm. Grossly normal heart sounds.  Good peripheral circulation. Respiratory: Normal respiratory effort.  No retractions. Lungs CTAB. Gastrointestinal: Soft , nondistended, nontender to palpation. No CVA tenderness. Musculoskeletal: No lower extremity tenderness nor edema.  No joint effusions. No signs of acute trauma. Neurologic:  Normal speech and language. No gross focal neurologic deficits are appreciated.  Cranial nerves II through XII intact 5/5 strength and sensation in all 4 extremities Skin:  Skin is warm, dry and intact. No rash  noted. Psychiatric: Mood and affect are normal. Speech and behavior are normal. ____________________________________________   LABS (all labs ordered are listed, but only abnormal results are displayed)  Labs Reviewed  BASIC METABOLIC PANEL - Abnormal; Notable for the following components:      Result Value   Glucose, Bld 126 (*)    BUN 24 (*)    All other components within normal limits  CBC - Abnormal; Notable for the following components:   MCV 79.9 (*)    Platelets 127 (*)    All other components within normal limits  URINALYSIS, COMPLETE (UACMP) WITH MICROSCOPIC  CBG MONITORING, ED   ____________________________________________  12 Lead EKG Sinus rhythm,  rate of 74 bpm.  Normal axis and intervals.  No evidence of acute ischemia.  ____________________________________________  RADIOLOGY  ED MD interpretation: CT head reviewed by me without evidence of acute intracranial pathology.  Official radiology report(s): CT Head Wo Contrast  Result Date: 03/08/2021 CLINICAL DATA:  Neck trauma. Head trauma, minor. Additional history provided: Posttraumatic dizziness, evaluate for intracranial hemorrhage, patient fell 4 days ago hitting left side of head. Headache. Dizziness. EXAM: CT HEAD WITHOUT CONTRAST CT CERVICAL SPINE WITHOUT CONTRAST TECHNIQUE: Multidetector CT imaging of the head and cervical spine was performed following the standard protocol without intravenous contrast. Multiplanar CT image reconstructions of the cervical spine were also generated. COMPARISON:  No pertinent prior exams available for comparison. FINDINGS: CT HEAD FINDINGS Brain: Cerebral volume is normal for age. There is no acute intracranial hemorrhage. No demarcated cortical infarct. No extra-axial fluid collection. No evidence of intracranial mass. No midline shift. Partially empty sella turcica. Vascular: No hyperdense vessel.  Atherosclerotic calcifications. Skull: Normal. Negative for fracture or focal lesion. Sinuses/Orbits: Visualized orbits show no acute finding. Trace scattered mucosal thickening and fluid within the bilateral ethmoid air cells. Trace mucosal thickening within the imaged maxillary sinuses. Small left sphenoid sinus air-fluid level. Other: Right mastoid effusion. CT CERVICAL SPINE FINDINGS Alignment: Cervical levocurvature. Straightening of the expected cervical lordosis. No significant spondylolisthesis. Skull base and vertebrae: The basion-dental and atlanto-dental intervals are maintained.No evidence of acute fracture to the cervical spine. Prior C6-C7 ACDF. No evidence of hardware compromise. Soft tissues and spinal canal: No prevertebral fluid or swelling. No  visible canal hematoma. Disc levels: Cervical spondylosis with multilevel disc space narrowing, a shallow disc bulges, uncovertebral hypertrophy and facet arthrosis. Disc space narrowing is greatest at C4-C5, C5-C6 and C7-T1 (mild/moderate at these levels). No appreciable high-grade spinal canal stenosis. No high-grade bony neural foraminal narrowing. Pannus formation posterior to the dens. Ventral osteophyte at C4-C5. Upper chest: No consolidation within the imaged lung apices. No visible pneumothorax IMPRESSION: CT head: 1. No evidence of acute intracranial abnormality. 2. Paranasal sinus disease, as described 3. Right mastoid effusion. CT cervical spine: 1. No evidence of acute fracture to the cervical spine. 2. Nonspecific straightening of the expected cervical lordosis. 3. Cervical levocurvature. 4. Prior C6-C7 ACDF.  No evidence of hardware compromise. 5. Cervical spondylosis, as described. Electronically Signed   By: Kellie Simmering DO   On: 03/08/2021 12:27   CT Cervical Spine Wo Contrast  Result Date: 03/08/2021 CLINICAL DATA:  Neck trauma. Head trauma, minor. Additional history provided: Posttraumatic dizziness, evaluate for intracranial hemorrhage, patient fell 4 days ago hitting left side of head. Headache. Dizziness. EXAM: CT HEAD WITHOUT CONTRAST CT CERVICAL SPINE WITHOUT CONTRAST TECHNIQUE: Multidetector CT imaging of the head and cervical spine was performed following the standard protocol without intravenous contrast. Multiplanar CT image  reconstructions of the cervical spine were also generated. COMPARISON:  No pertinent prior exams available for comparison. FINDINGS: CT HEAD FINDINGS Brain: Cerebral volume is normal for age. There is no acute intracranial hemorrhage. No demarcated cortical infarct. No extra-axial fluid collection. No evidence of intracranial mass. No midline shift. Partially empty sella turcica. Vascular: No hyperdense vessel.  Atherosclerotic calcifications. Skull: Normal.  Negative for fracture or focal lesion. Sinuses/Orbits: Visualized orbits show no acute finding. Trace scattered mucosal thickening and fluid within the bilateral ethmoid air cells. Trace mucosal thickening within the imaged maxillary sinuses. Small left sphenoid sinus air-fluid level. Other: Right mastoid effusion. CT CERVICAL SPINE FINDINGS Alignment: Cervical levocurvature. Straightening of the expected cervical lordosis. No significant spondylolisthesis. Skull base and vertebrae: The basion-dental and atlanto-dental intervals are maintained.No evidence of acute fracture to the cervical spine. Prior C6-C7 ACDF. No evidence of hardware compromise. Soft tissues and spinal canal: No prevertebral fluid or swelling. No visible canal hematoma. Disc levels: Cervical spondylosis with multilevel disc space narrowing, a shallow disc bulges, uncovertebral hypertrophy and facet arthrosis. Disc space narrowing is greatest at C4-C5, C5-C6 and C7-T1 (mild/moderate at these levels). No appreciable high-grade spinal canal stenosis. No high-grade bony neural foraminal narrowing. Pannus formation posterior to the dens. Ventral osteophyte at C4-C5. Upper chest: No consolidation within the imaged lung apices. No visible pneumothorax IMPRESSION: CT head: 1. No evidence of acute intracranial abnormality. 2. Paranasal sinus disease, as described 3. Right mastoid effusion. CT cervical spine: 1. No evidence of acute fracture to the cervical spine. 2. Nonspecific straightening of the expected cervical lordosis. 3. Cervical levocurvature. 4. Prior C6-C7 ACDF.  No evidence of hardware compromise. 5. Cervical spondylosis, as described. Electronically Signed   By: Kellie Simmering DO   On: 03/08/2021 12:27    ____________________________________________   PROCEDURES and INTERVENTIONS  Procedure(s) performed (including Critical Care):  .1-3 Lead EKG Interpretation Performed by: Vladimir Crofts, MD Authorized by: Vladimir Crofts, MD      Interpretation: normal     ECG rate:  70   ECG rate assessment: normal     Rhythm: sinus rhythm     Ectopy: none     Conduction: normal      Medications  acetaminophen (TYLENOL) tablet 1,000 mg (1,000 mg Oral Given 03/08/21 1209)  meclizine (ANTIVERT) tablet 25 mg (25 mg Oral Given 03/08/21 1210)    ____________________________________________   MDM / ED COURSE   70 year old woman presents to the ED with posttraumatic positional vertigo, likely BPPV without evidence of central nervous pathology and amenable to outpatient management.  Normal vitals.  Exam with rightward horizontal nystagmus, with associated worsening in her vertigo symptoms, but no evidence of focal neurologic deficits.  She clinically looks well.  Blood work is benign.  CT head without evidence of ICH or signs of CVA.  CT cervical spine without evidence of hardware malalignment.  Blood work is unremarkable and EKG is nonischemic.  Improved symptoms with Tylenol and meclizine, and we discussed using his medications at home.  We discussed following up with ENT and patient is stable for outpatient management.  Clinical Course as of 03/08/21 1657  Tue Mar 08, 2021  1257 Reassessed.  Patient reports improving symptoms of headache and dizziness.  We discussed following up with ENT and we discussed return precautions for the ED. [DS]    Clinical Course User Index [DS] Vladimir Crofts, MD    ____________________________________________   FINAL CLINICAL IMPRESSION(S) / ED DIAGNOSES  Final diagnoses:  Vertigo  BPPV (benign  paroxysmal positional vertigo), right     ED Discharge Orders         Ordered    meclizine (ANTIVERT) 25 MG tablet  3 times daily PRN        03/08/21 1250           Graceanna Theissen   Note:  This document was prepared using Systems analyst and may include unintentional dictation errors.   Vladimir Crofts, MD 03/08/21 [phone]

## 2021-03-08 NOTE — ED Triage Notes (Signed)
First Nurse Note:  Arrives via [facility].  C/O fall saturday night, lost balance, struck head on side of night stand.  Also fell Sunday from loss of balance.  C/O worsening dizziness since fall on Saturday and nausea this morning.    VS wnl.  Lives at home with friend.  No blood thinners.  No LOC.

## 2021-03-08 NOTE — Discharge Instructions (Addendum)
As we discussed, I suspect positional vertigo due to your fall on Saturday.  Use meclizine/Antivert medicine up to 3 times per day as needed for any further dizziness.  I have attached the information for local ENT physician to follow-up with him in the clinic.   If you develop any dizziness that will not go away with medication or sitting still, fevers or uncontrolled symptoms, please return to the ED.

## 2021-03-23 DIAGNOSIS — R42 Dizziness and giddiness: Secondary | ICD-10-CM | POA: Diagnosis not present

## 2021-03-23 DIAGNOSIS — H6123 Impacted cerumen, bilateral: Secondary | ICD-10-CM | POA: Diagnosis not present

## 2021-03-23 DIAGNOSIS — H8112 Benign paroxysmal vertigo, left ear: Secondary | ICD-10-CM | POA: Diagnosis not present

## 2021-03-30 DIAGNOSIS — R42 Dizziness and giddiness: Secondary | ICD-10-CM | POA: Diagnosis not present

## 2021-04-19 DIAGNOSIS — I2510 Atherosclerotic heart disease of native coronary artery without angina pectoris: Secondary | ICD-10-CM | POA: Diagnosis not present

## 2021-04-19 DIAGNOSIS — R0602 Shortness of breath: Secondary | ICD-10-CM | POA: Diagnosis not present

## 2021-04-19 DIAGNOSIS — I5022 Chronic systolic (congestive) heart failure: Secondary | ICD-10-CM | POA: Diagnosis not present

## 2021-04-19 DIAGNOSIS — E782 Mixed hyperlipidemia: Secondary | ICD-10-CM | POA: Diagnosis not present

## 2021-04-19 DIAGNOSIS — I10 Essential (primary) hypertension: Secondary | ICD-10-CM | POA: Diagnosis not present

## 2021-04-25 DIAGNOSIS — R739 Hyperglycemia, unspecified: Secondary | ICD-10-CM | POA: Diagnosis not present

## 2021-04-25 DIAGNOSIS — N1832 Chronic kidney disease, stage 3b: Secondary | ICD-10-CM | POA: Diagnosis not present

## 2021-04-25 DIAGNOSIS — E039 Hypothyroidism, unspecified: Secondary | ICD-10-CM | POA: Diagnosis not present

## 2021-05-02 DIAGNOSIS — I2510 Atherosclerotic heart disease of native coronary artery without angina pectoris: Secondary | ICD-10-CM | POA: Diagnosis not present

## 2021-05-02 DIAGNOSIS — N1832 Chronic kidney disease, stage 3b: Secondary | ICD-10-CM | POA: Diagnosis not present

## 2021-05-02 DIAGNOSIS — F419 Anxiety disorder, unspecified: Secondary | ICD-10-CM | POA: Diagnosis not present

## 2021-05-02 DIAGNOSIS — E039 Hypothyroidism, unspecified: Secondary | ICD-10-CM | POA: Diagnosis not present

## 2021-05-02 DIAGNOSIS — I5022 Chronic systolic (congestive) heart failure: Secondary | ICD-10-CM | POA: Diagnosis not present

## 2021-05-02 DIAGNOSIS — F325 Major depressive disorder, single episode, in full remission: Secondary | ICD-10-CM | POA: Diagnosis not present

## 2021-05-02 DIAGNOSIS — I130 Hypertensive heart and chronic kidney disease with heart failure and stage 1 through stage 4 chronic kidney disease, or unspecified chronic kidney disease: Secondary | ICD-10-CM | POA: Diagnosis not present

## 2021-05-05 DIAGNOSIS — R1032 Left lower quadrant pain: Secondary | ICD-10-CM | POA: Diagnosis not present

## 2021-05-05 DIAGNOSIS — K5792 Diverticulitis of intestine, part unspecified, without perforation or abscess without bleeding: Secondary | ICD-10-CM | POA: Diagnosis not present

## 2021-05-05 DIAGNOSIS — R1031 Right lower quadrant pain: Secondary | ICD-10-CM | POA: Diagnosis not present

## 2021-05-11 ENCOUNTER — Emergency Department
Admission: RE | Admit: 2021-05-11 | Discharge: 2021-05-11 | Disposition: A | Discharge status: Home / Self Care | Payer: Medicare HMO | Source: Ambulatory Visit | Attending: Gastroenterology | Admitting: Gastroenterology

## 2021-05-11 ENCOUNTER — Other Ambulatory Visit: Payer: Self-pay | Admitting: Gastroenterology

## 2021-05-11 ENCOUNTER — Other Ambulatory Visit: Payer: Self-pay

## 2021-05-11 ENCOUNTER — Emergency Department
Admission: EM | Admit: 2021-05-11 | Discharge: 2021-05-11 | Disposition: A | Discharge status: Home / Self Care | Payer: Medicare HMO | Attending: Emergency Medicine | Admitting: Emergency Medicine

## 2021-05-11 DIAGNOSIS — K6389 Other specified diseases of intestine: Secondary | ICD-10-CM | POA: Diagnosis not present

## 2021-05-11 DIAGNOSIS — E860 Dehydration: Secondary | ICD-10-CM | POA: Diagnosis not present

## 2021-05-11 DIAGNOSIS — I5022 Chronic systolic (congestive) heart failure: Secondary | ICD-10-CM | POA: Diagnosis not present

## 2021-05-11 DIAGNOSIS — R1032 Left lower quadrant pain: Secondary | ICD-10-CM

## 2021-05-11 DIAGNOSIS — Z9049 Acquired absence of other specified parts of digestive tract: Secondary | ICD-10-CM | POA: Diagnosis not present

## 2021-05-11 DIAGNOSIS — E039 Hypothyroidism, unspecified: Secondary | ICD-10-CM | POA: Insufficient documentation

## 2021-05-11 DIAGNOSIS — I10 Essential (primary) hypertension: Secondary | ICD-10-CM | POA: Diagnosis not present

## 2021-05-11 DIAGNOSIS — Z853 Personal history of malignant neoplasm of breast: Secondary | ICD-10-CM | POA: Diagnosis not present

## 2021-05-11 DIAGNOSIS — K5792 Diverticulitis of intestine, part unspecified, without perforation or abscess without bleeding: Secondary | ICD-10-CM | POA: Insufficient documentation

## 2021-05-11 DIAGNOSIS — Z951 Presence of aortocoronary bypass graft: Secondary | ICD-10-CM | POA: Diagnosis not present

## 2021-05-11 DIAGNOSIS — R109 Unspecified abdominal pain: Secondary | ICD-10-CM | POA: Diagnosis present

## 2021-05-11 DIAGNOSIS — N858 Other specified noninflammatory disorders of uterus: Secondary | ICD-10-CM | POA: Diagnosis not present

## 2021-05-11 DIAGNOSIS — Z87891 Personal history of nicotine dependence: Secondary | ICD-10-CM | POA: Insufficient documentation

## 2021-05-11 DIAGNOSIS — K5732 Diverticulitis of large intestine without perforation or abscess without bleeding: Secondary | ICD-10-CM | POA: Diagnosis not present

## 2021-05-11 LAB — URINALYSIS, COMPLETE (UACMP) WITH MICROSCOPIC
Bilirubin Urine: NEGATIVE
Glucose, UA: NEGATIVE mg/dL
Hgb urine dipstick: NEGATIVE
Ketones, ur: NEGATIVE mg/dL
Leukocytes,Ua: NEGATIVE
Nitrite: NEGATIVE
Protein, ur: NEGATIVE mg/dL
Specific Gravity, Urine: >1.046 — ABNORMAL HIGH (ref 1.005–1.030)
pH: 5 (ref 5.0–8.0)

## 2021-05-11 LAB — COMPREHENSIVE METABOLIC PANEL
ALT: 17 U/L (ref 0–44)
AST: 26 U/L (ref 15–41)
Albumin: 3.3 g/dL — ABNORMAL LOW (ref 3.5–5.0)
Alkaline Phosphatase: 76 U/L (ref 38–126)
Anion gap: 11 (ref 5–15)
BUN: 18 mg/dL (ref 8–23)
CO2: 24 mmol/L (ref 22–32)
Calcium: 9.1 mg/dL (ref 8.9–10.3)
Chloride: 102 mmol/L (ref 98–111)
Creatinine, Ser: 0.95 mg/dL (ref 0.44–1.00)
GFR, Estimated: >60 mL/min (ref >60)
Glucose, Bld: 147 mg/dL — ABNORMAL HIGH (ref 70–99)
Potassium: 3.7 mmol/L (ref 3.5–5.1)
Sodium: 137 mmol/L (ref 135–145)
Total Bilirubin: 0.5 mg/dL (ref 0.3–1.2)
Total Protein: 6.9 g/dL (ref 6.5–8.1)

## 2021-05-11 LAB — CBC WITH DIFFERENTIAL/PLATELET
Abs Immature Granulocytes: 0.04 10*3/uL (ref 0.00–0.07)
Basophils Absolute: 0 10*3/uL (ref 0.0–0.1)
Basophils Relative: 0 %
Eosinophils Absolute: 0.1 10*3/uL (ref 0.0–0.5)
Eosinophils Relative: 1 %
HCT: 35.9 % — ABNORMAL LOW (ref 36.0–46.0)
Hemoglobin: 12 g/dL (ref 12.0–15.0)
Immature Granulocytes: 1 %
Lymphocytes Relative: 25 %
Lymphs Abs: 1.9 10*3/uL (ref 0.7–4.0)
MCH: 26.6 pg (ref 26.0–34.0)
MCHC: 33.4 g/dL (ref 30.0–36.0)
MCV: 79.6 fL — ABNORMAL LOW (ref 80.0–100.0)
Monocytes Absolute: 0.5 10*3/uL (ref 0.1–1.0)
Monocytes Relative: 6 %
Neutro Abs: 5.1 10*3/uL (ref 1.7–7.7)
Neutrophils Relative %: 67 %
Platelets: 224 10*3/uL (ref 150–400)
RBC: 4.51 MIL/uL (ref 3.87–5.11)
RDW: 14.4 % (ref 11.5–15.5)
WBC: 7.6 10*3/uL (ref 4.0–10.5)
nRBC: 0 % (ref 0.0–0.2)

## 2021-05-11 LAB — LACTIC ACID, PLASMA: Lactic Acid, Venous: 1.5 mmol/L (ref 0.5–1.9)

## 2021-05-11 MED ORDER — DICYCLOMINE HCL 10 MG PO CAPS: 10.0000 mg | ORAL_CAPSULE | Freq: Four times a day (QID) | ORAL | 0 refills | Status: DC

## 2021-05-11 MED ORDER — DICYCLOMINE HCL 10 MG PO CAPS
10.0000 mg | ORAL_CAPSULE | Freq: Once | ORAL | Status: AC
  Administered 2021-05-11: 10 mg via ORAL
  Filled 2021-05-11: qty 1

## 2021-05-11 MED ORDER — LACTATED RINGERS IV BOLUS
1000.0000 mL | Freq: Once | INTRAVENOUS | Status: AC
  Administered 2021-05-11: 1000 mL via INTRAVENOUS

## 2021-05-11 MED ORDER — CIPROFLOXACIN HCL 500 MG PO TABS
500.0000 mg | ORAL_TABLET | Freq: Once | ORAL | Status: AC
  Administered 2021-05-11: 500 mg via ORAL
  Filled 2021-05-11: qty 1

## 2021-05-11 MED ORDER — IOHEXOL 350 MG/ML SOLN
80.0000 mL | Freq: Once | INTRAVENOUS | Status: AC | PRN
  Administered 2021-05-11: 80 mL via INTRAVENOUS

## 2021-05-11 MED ORDER — CIPROFLOXACIN HCL 500 MG PO TABS: 500.0000 mg | ORAL_TABLET | Freq: Two times a day (BID) | ORAL | 0 refills | Status: AC

## 2021-05-11 MED ORDER — METRONIDAZOLE 500 MG PO TABS
500.0000 mg | ORAL_TABLET | Freq: Once | ORAL | Status: AC
  Administered 2021-05-11: 500 mg via ORAL
  Filled 2021-05-11: qty 1

## 2021-05-11 MED ORDER — METRONIDAZOLE 500 MG PO TABS: 500.0000 mg | ORAL_TABLET | Freq: Three times a day (TID) | ORAL | 0 refills | Status: AC

## 2021-05-11 NOTE — ED Provider Notes (Signed)
[facility] Emergency Department Provider Note  ____________________________________________   Event Date/Time   First MD Initiated Contact with Patient 05/11/21 1832     (approximate)  I have reviewed the triage vital signs and the nursing notes.   HISTORY  Chief Complaint Abdominal Pain   HPI Ann Parker is a 70 y.o. female with a past medical history of anxiety, breast cancer status post left lumpectomy, CHF, depression, fibromyalgia, GERD, obesity, insomnia and previous episodes of diverticulitis who presents for assessment of some persistent crampy abdominal pain associated with some nonbloody diarrhea and nausea over the last 1 to 2 weeks that has not been improving after patient has been taking Augmentin for 7 days.  She was sent to the ED by her PCP today for concerns attributable dehydrated for further evaluation.  She denies any chest pain, cough, shortness of breath change in chronic shortness of breath with.  She has not had any weight gain or any swelling in her legs.  She denies any urinary symptoms has not vomited today.  She has no recent injuries or falls or rashes or other clear associated sick symptoms.  She states she has been taking her Zofran as needed but has had less p.o. intake over the last couple days.         Past Medical History:  Diagnosis Date   Anxiety    Bilateral anterior knee pain    Breast cancer ([place]) 2000   left lumpectomy   CHF (congestive heart failure) (HCC)    CHF (congestive heart failure) (HCC)    Chronic insomnia    Depression    Dysphagia    Fibromyalgia    GERD (gastroesophageal reflux disease)    Hyperlipidemia    Hypothyroidism    Obesity, Class III, BMI 40-49.9 (morbid obesity) (HCC)    Pericardial effusion    Personal history of chemotherapy 2000   left breast cancer   Personal history of radiation therapy 2000   left breast cancer   Precordial pain    SOB (shortness of breath)    Urinary  incontinence in female     Patient Active Problem List   Diagnosis Date Noted   Chronic insomnia 12/16/2016   Bilateral anterior knee pain 08/22/2016   Depression, major, single episode, complete remission ([place]) 06/15/2016   Dysphagia, unspecified 06/15/2016   Urinary incontinence in female 06/15/2016   Body mass index (BMI) of 40.1-44.9 in adult ([place]) [id number]   Chronic systolic CHF (congestive heart failure), NYHA class 3 ([place]) 08/11/2015   Mixed hyperlipidemia 08/11/2015   Pericardial effusion 08/11/2015   Acquired hypothyroidism 08/03/2015   Anxiety 08/03/2015   Fibromyalgia 08/03/2015   Gastroesophageal reflux disease without esophagitis 08/03/2015    Past Surgical History:  Procedure Laterality Date   BREAST BIOPSY Left 2000   Positive   BREAST LUMPECTOMY Left 2000   BREAST LUMPECTOMY WITH AXILLARY LYMPH NODE DISSECTION Left 2000   BREAST SURGERY     CORONARY ARTERY BYPASS GRAFT     ESOPHAGOGASTRODUODENOSCOPY (EGD) WITH PROPOFOL N/A 01/08/2017   Procedure: ESOPHAGOGASTRODUODENOSCOPY (EGD) WITH PROPOFOL;  Surgeon: Manya Silvas, MD;  Location: [facility] ENDOSCOPY;  Service: Endoscopy;  Laterality: N/A;   NECK SURGERY      Prior to Admission medications   Medication Sig Start Date End Date Taking? Authorizing Provider  ciprofloxacin (CIPRO) 500 MG tablet Take 1 tablet (500 mg total) by mouth 2 (two) times daily for 10 days. 05/11/21 05/21/21 Yes Lucrezia Starch, MD  dicyclomine (BENTYL) 10 MG capsule Take 1 capsule (10 mg total) by mouth 4 (four) times daily for 5 days. 05/11/21 05/16/21 Yes Lucrezia Starch, MD  metroNIDAZOLE (FLAGYL) 500 MG tablet Take 1 tablet (500 mg total) by mouth 3 (three) times daily for 10 days. 05/11/21 05/21/21 Yes Lucrezia Starch, MD  albuterol (PROVENTIL HFA;VENTOLIN HFA) 108 (90 Base) MCG/ACT inhaler Inhale into the lungs every 6 (six) hours as needed for wheezing or shortness of breath.    [provider]  budesonide-formoterol (SYMBICORT)  80-4.5 MCG/ACT inhaler Inhale 2 puffs into the lungs 2 (two) times daily.    [provider]  citalopram (CELEXA) 20 MG tablet Take 20 mg by mouth daily.    [provider]  ENTRESTO 24-26 MG Take 1 tablet by mouth 2 (two) times daily. 01/26/20   [provider]  ezetimibe (ZETIA) 10 MG tablet Take 10 mg by mouth daily. 01/27/20   [provider]  furosemide (LASIX) 40 MG tablet Take 40 mg by mouth daily.    [provider]  meclizine (ANTIVERT) 25 MG tablet Take 1 tablet (25 mg total) by mouth 3 (three) times daily as needed for dizziness. 03/08/21   Vladimir Crofts, MD  metoprolol succinate (TOPROL-XL) 50 MG 24 hr tablet Take 50 mg by mouth daily. Take with or immediately following a meal.    [provider]  ondansetron (ZOFRAN ODT) 8 MG disintegrating tablet Take 1 tablet (8 mg total) by mouth every 8 (eight) hours as needed. 02/10/20   Arta Silence, MD  pantoprazole (PROTONIX) 40 MG tablet Take 40 mg by mouth daily.    [provider]  potassium chloride (K-DUR,KLOR-CON) 10 MEQ tablet Take 10 mEq by mouth 2 (two) times daily.    [provider]  pregabalin (LYRICA) 150 MG capsule Take 150 mg by mouth 3 (three) times daily.    [provider]  traMADol (ULTRAM) 50 MG tablet Take 1 tablet (50 mg total) by mouth every 6 (six) hours as needed. 02/14/21 02/14/22  Nance Pear, MD  traZODone (DESYREL) 50 MG tablet Take 50 mg by mouth at bedtime.    [provider]    Allergies Codeine  Family History  Problem Relation Age of Onset   Breast cancer Daughter 36    Social History Social History   Tobacco Use   Smoking status: Former   Smokeless tobacco: Never  Substance Use Topics   Alcohol use: No   Drug use: No    Review of Systems  Review of Systems  Constitutional:  Positive for malaise/fatigue. Negative for chills and fever.  HENT:  Negative for sore throat.   Eyes:  Negative for pain.   Respiratory:  Negative for cough and stridor.   Cardiovascular:  Negative for chest pain.  Gastrointestinal:  Positive for abdominal pain, diarrhea and nausea.  Genitourinary:  Negative for dysuria.  Musculoskeletal:  Negative for myalgias.  Skin:  Negative for rash.  Neurological:  Positive for weakness. Negative for seizures, loss of consciousness and headaches.  Psychiatric/Behavioral:  Negative for suicidal ideas.   All other systems reviewed and are negative.    ____________________________________________   PHYSICAL EXAM:  VITAL SIGNS: ED Triage Vitals [05/11/21 1551]  Enc Vitals Group     BP (!) 105/58     Pulse Rate 81     Resp 16     Temp 98.3 F (36.8 C)     Temp Source Oral     SpO2 98 %  Weight 246 lb (111.6 kg)     Height '5\' 7"'$  (1.702 m)     Head Circumference      Peak Flow      Pain Score 10     Pain Loc      Pain Edu?      Excl. in [place]?    Vitals:   05/11/21 1551 05/11/21 1910  BP: (!) 105/58 123/64  Pulse: 81 74  Resp: 16 17  Temp: 98.3 F (36.8 C)   SpO2: 98% 97%   Physical Exam Vitals and nursing note reviewed.  Constitutional:      General: She is not in acute distress.    Appearance: She is well-developed. She is obese.  HENT:     Head: Normocephalic and atraumatic.     Right Ear: External ear normal.     Left Ear: External ear normal.     Nose: Nose normal.     Mouth/Throat:     Mouth: Mucous membranes are dry.  Eyes:     Conjunctiva/sclera: Conjunctivae normal.  Cardiovascular:     Rate and Rhythm: Normal rate and regular rhythm.     Heart sounds: No murmur heard. Pulmonary:     Effort: Pulmonary effort is normal. No respiratory distress.     Breath sounds: Normal breath sounds.  Abdominal:     Palpations: Abdomen is soft.     Tenderness: There is generalized abdominal tenderness. There is no right CVA tenderness or left CVA tenderness.  Musculoskeletal:     Cervical back: Neck supple.  Skin:    General: Skin is warm and  dry.     Capillary Refill: Capillary refill takes 2 to 3 seconds.  Neurological:     Mental Status: She is alert and oriented to person, place, and time.  Psychiatric:        Mood and Affect: Mood normal.     ____________________________________________   LABS (all labs ordered are listed, but only abnormal results are displayed)  Labs Reviewed  CBC WITH DIFFERENTIAL/PLATELET - Abnormal; Notable for the following components:      Result Value   HCT 35.9 (*)    MCV 79.6 (*)    All other components within normal limits  COMPREHENSIVE METABOLIC PANEL - Abnormal; Notable for the following components:   Glucose, Bld 147 (*)    Albumin 3.3 (*)    All other components within normal limits  URINALYSIS, COMPLETE (UACMP) WITH MICROSCOPIC - Abnormal; Notable for the following components:   Color, Urine YELLOW (*)    APPearance HAZY (*)    Specific Gravity, Urine >1.046 (*)    Bacteria, UA RARE (*)    All other components within normal limits  LACTIC ACID, PLASMA  LACTIC ACID, PLASMA   ____________________________________________  EKG  Sinus rhythm with a ventricular rate of 54, left axis deviation without clear evidence of acute ischemia or significant arrhythmia.  Nonspecific change in V2 is unchanged when compared to ECG on 03/08/2021. ____________________________________________  RADIOLOGY  ED MD interpretation: CT abdomen pelvis obtained today shows acute uncomplicated diverticulitis without evidence of perforation or abscess.  No evidence of kidney stone, pyelonephritis, cholecystitis, pancreatitis or other clear acute abdominal pelvic process.  Indeterminant area of enhancement in the right atrium possibly related to artifact versus mass.  Discussed this with patient and daughter states they are aware and planning to undergo an echocardiogram.   Official radiology report(s): CT ABDOMEN PELVIS W CONTRAST  Result Date: 05/11/2021 CLINICAL DATA:  Left lower  quadrant pain EXAM: CT  ABDOMEN AND PELVIS WITH CONTRAST TECHNIQUE: Multidetector CT imaging of the abdomen and pelvis was performed using the standard protocol following bolus administration of intravenous contrast. CONTRAST:  47m OMNIPAQUE IOHEXOL 350 MG/ML SOLN COMPARISON:  CT abdomen and pelvis dated Feb 14, 2021 FINDINGS: Lower chest: Incompletely imaged indeterminate rounded area of enhancement seen in the upper right atrium. No acute findings in the upper chest. Stable small 2 mm pulmonary nodule seen in the left lower lobe on series 2 image 12. Hepatobiliary: No focal liver abnormality is seen. Status post cholecystectomy. No biliary dilatation. Pancreas: Unremarkable. No pancreatic ductal dilatation or surrounding inflammatory changes. Spleen: Normal in size without focal abnormality. Adrenals/Urinary Tract: Bilateral adrenal glands are unremarkable. No hydronephrosis of the bilateral kidneys. Bilateral low-density renal lesions, largest are compatible with simple cysts, others are too small to completely characterize. Mild fat stranding adjacent to the anterior bladder wall, likely reactive. Stomach/Bowel: Wall thickening adjacent inflammatory change of the sigmoid colon. Numerous sigmoid diverticula are seen no fluid collections or free intraperitoneal air. Vascular/Lymphatic: No significant vascular findings are present. No enlarged abdominal or pelvic lymph nodes. Reproductive: Calcifications are seen within the uterus, likely due to calcified fibroids. Other: No abdominal wall hernia or abnormality. No abdominopelvic ascites. Musculoskeletal: No acute or significant osseous findings. IMPRESSION: Findings compatible with acute uncomplicated diverticulitis of the sigmoid colon. Incompletely imaged indeterminate rounded area of enhancement seen in the upper right atrium, possibly related to mixing artifact, although cardiac mass could have a similar appearance. Finding could be further evaluated with echocardiography. These  results will be called to the ordering clinician or representative by the Radiologist Assistant, and communication documented in the PACS or [employer] Electronically Signed   By: LYetta GlassmanMD   On: 05/11/2021 14:30    ____________________________________________   PROCEDURES  Procedure(s) performed (including Critical Care):  Procedures   ____________________________________________   INITIAL IMPRESSION / ASSESSMENT AND PLAN / ED COURSE      Presents with above to history exam for assessment of some persistent crampy abdominal pain associate with nausea, vomiting diarrhea seemingly improving after about a week of Augmentin.  She is afebrile and hemodynamically stable on arrival.  He does appear mildly dehydrated.  Does have some mild tenderness to her abdomen but is not peritonitis or guarding and has no significant CVA tenderness.  Differential includes possible continued symptoms of diverticulitis, development of abscess, perforation, kidney stone, cystitis, appendicitis, pancreatitis and acute cholestatic process.  CT abdomen pelvis obtained today shows acute uncomplicated diverticulitis without evidence of perforation or abscess.  No evidence of kidney stone, pyelonephritis, cholecystitis, pancreatitis or other clear acute abdominal pelvic process.  Indeterminant area of enhancement in the right atrium possibly related to artifact versus mass.  Discussed this with patient and daughter states they are aware and planning to undergo an echocardiogram.   CMP shows no significant electrolyte or metabolic derangements.  CBC shows no leukocytosis or acute anemia.  UA shows rare bacteria but no WBCs, leukocyte esterase, nitrites or other convincing evidence of cystitis at this time.  Overall I suspect acute on collocated diverticulitis not improving on Augmentin.  Discussed with on-call gastroenterologist Dr. TAlice Reichertwho recommended switching to Cipro Flagyl.  We will give a dose  while in emergency room.  Do not believe patient is septic and she has no evidence of complication on her CT.  She was given some Bentyl for some crampy abdominal pain.  He was hydrated for some mildly high  dehydration.  However given she is able to tolerate p.o. with low suspicion for sepsis or evidence of complication I do not believe she requires hospitalization at this time.  Advised her to follow-up closely with GI and her PCP.  Discharged stable condition.  Strict return precautions advised and discussed.   ____________________________________________   FINAL CLINICAL IMPRESSION(S) / ED DIAGNOSES  Final diagnoses:  Diverticulitis    Medications  dicyclomine (BENTYL) capsule 10 mg (has no administration in time range)  metroNIDAZOLE (FLAGYL) tablet 500 mg (has no administration in time range)  ciprofloxacin (CIPRO) tablet 500 mg (has no administration in time range)  lactated ringers bolus 1,000 mL (1,000 mLs Intravenous New Bag/Given 05/11/21 2002)     ED Discharge Orders          Ordered    metroNIDAZOLE (FLAGYL) 500 MG tablet  3 times daily        05/11/21 2001    ciprofloxacin (CIPRO) 500 MG tablet  2 times daily        05/11/21 2001    dicyclomine (BENTYL) 10 MG capsule  4 times daily        05/11/21 2031             Note:  This document was prepared using Dragon voice recognition software and may include unintentional dictation errors.    Lucrezia Starch, MD 05/11/21 2033

## 2021-05-11 NOTE — ED Triage Notes (Signed)
Pt to the ER via POV with complaints of left lower quadrant pain and diarrhea since 7/27. Pt was seen by pcp and diagnosed with diverticulitis, had been taking augmentin started 7/28.  Denies blood in stool.

## 2021-05-13 DIAGNOSIS — R0602 Shortness of breath: Secondary | ICD-10-CM | POA: Diagnosis not present

## 2021-05-13 DIAGNOSIS — I5022 Chronic systolic (congestive) heart failure: Secondary | ICD-10-CM | POA: Diagnosis not present

## 2021-05-17 DIAGNOSIS — K5792 Diverticulitis of intestine, part unspecified, without perforation or abscess without bleeding: Secondary | ICD-10-CM | POA: Diagnosis not present

## 2021-05-18 DIAGNOSIS — N1832 Chronic kidney disease, stage 3b: Secondary | ICD-10-CM | POA: Diagnosis not present

## 2021-05-18 DIAGNOSIS — K5792 Diverticulitis of intestine, part unspecified, without perforation or abscess without bleeding: Secondary | ICD-10-CM | POA: Diagnosis not present

## 2021-05-18 DIAGNOSIS — I5022 Chronic systolic (congestive) heart failure: Secondary | ICD-10-CM | POA: Diagnosis not present

## 2021-05-23 DIAGNOSIS — I2510 Atherosclerotic heart disease of native coronary artery without angina pectoris: Secondary | ICD-10-CM | POA: Diagnosis not present

## 2021-05-23 DIAGNOSIS — E782 Mixed hyperlipidemia: Secondary | ICD-10-CM | POA: Diagnosis not present

## 2021-05-23 DIAGNOSIS — N1832 Chronic kidney disease, stage 3b: Secondary | ICD-10-CM | POA: Diagnosis not present

## 2021-05-23 DIAGNOSIS — I10 Essential (primary) hypertension: Secondary | ICD-10-CM | POA: Diagnosis not present

## 2021-05-23 DIAGNOSIS — I5022 Chronic systolic (congestive) heart failure: Secondary | ICD-10-CM | POA: Diagnosis not present

## 2021-05-27 ENCOUNTER — Other Ambulatory Visit: Payer: Self-pay | Admitting: Internal Medicine

## 2021-05-27 DIAGNOSIS — Z1231 Encounter for screening mammogram for malignant neoplasm of breast: Secondary | ICD-10-CM

## 2021-06-08 DIAGNOSIS — H353132 Nonexudative age-related macular degeneration, bilateral, intermediate dry stage: Secondary | ICD-10-CM | POA: Diagnosis not present

## 2021-06-08 DIAGNOSIS — H5213 Myopia, bilateral: Secondary | ICD-10-CM | POA: Diagnosis not present

## 2021-06-08 DIAGNOSIS — Z0100 Encounter for examination of eyes and vision without abnormal findings: Secondary | ICD-10-CM | POA: Diagnosis not present

## 2021-06-24 DIAGNOSIS — I5022 Chronic systolic (congestive) heart failure: Secondary | ICD-10-CM | POA: Diagnosis not present

## 2021-06-24 DIAGNOSIS — I110 Hypertensive heart disease with heart failure: Secondary | ICD-10-CM | POA: Diagnosis not present

## 2021-06-24 DIAGNOSIS — J069 Acute upper respiratory infection, unspecified: Secondary | ICD-10-CM | POA: Diagnosis not present

## 2021-06-24 DIAGNOSIS — I2510 Atherosclerotic heart disease of native coronary artery without angina pectoris: Secondary | ICD-10-CM | POA: Diagnosis not present

## 2021-07-12 DIAGNOSIS — H40013 Open angle with borderline findings, low risk, bilateral: Secondary | ICD-10-CM | POA: Diagnosis not present

## 2021-07-25 DIAGNOSIS — K5792 Diverticulitis of intestine, part unspecified, without perforation or abscess without bleeding: Secondary | ICD-10-CM | POA: Diagnosis not present

## 2021-08-10 ENCOUNTER — Encounter (HOSPITAL_COMMUNITY): Payer: Self-pay

## 2021-08-11 ENCOUNTER — Encounter: Payer: Self-pay | Admitting: *Deleted

## 2021-08-12 ENCOUNTER — Encounter: Payer: Self-pay | Admitting: *Deleted

## 2021-08-12 ENCOUNTER — Ambulatory Visit: Payer: Medicare HMO | Admitting: Anesthesiology

## 2021-08-12 ENCOUNTER — Encounter
Admission: RE | Disposition: A | Discharge status: Home / Self Care | Payer: Self-pay | Source: Home / Self Care | Attending: Gastroenterology

## 2021-08-12 ENCOUNTER — Ambulatory Visit
Admission: RE | Admit: 2021-08-12 | Discharge: 2021-08-12 | Disposition: A | Discharge status: Home / Self Care | Payer: Medicare HMO | Attending: Gastroenterology | Admitting: Gastroenterology

## 2021-08-12 ENCOUNTER — Ambulatory Visit: Payer: Self-pay | Admitting: Surgery

## 2021-08-12 DIAGNOSIS — Z888 Allergy status to other drugs, medicaments and biological substances status: Secondary | ICD-10-CM | POA: Diagnosis not present

## 2021-08-12 DIAGNOSIS — E039 Hypothyroidism, unspecified: Secondary | ICD-10-CM | POA: Diagnosis not present

## 2021-08-12 DIAGNOSIS — Z6836 Body mass index (BMI) 36.0-36.9, adult: Secondary | ICD-10-CM | POA: Diagnosis not present

## 2021-08-12 DIAGNOSIS — K5730 Diverticulosis of large intestine without perforation or abscess without bleeding: Secondary | ICD-10-CM | POA: Insufficient documentation

## 2021-08-12 DIAGNOSIS — Z7951 Long term (current) use of inhaled steroids: Secondary | ICD-10-CM | POA: Diagnosis not present

## 2021-08-12 DIAGNOSIS — N189 Chronic kidney disease, unspecified: Secondary | ICD-10-CM | POA: Diagnosis not present

## 2021-08-12 DIAGNOSIS — E785 Hyperlipidemia, unspecified: Secondary | ICD-10-CM | POA: Insufficient documentation

## 2021-08-12 DIAGNOSIS — I5022 Chronic systolic (congestive) heart failure: Secondary | ICD-10-CM | POA: Insufficient documentation

## 2021-08-12 DIAGNOSIS — Z9049 Acquired absence of other specified parts of digestive tract: Secondary | ICD-10-CM | POA: Insufficient documentation

## 2021-08-12 DIAGNOSIS — K219 Gastro-esophageal reflux disease without esophagitis: Secondary | ICD-10-CM | POA: Diagnosis not present

## 2021-08-12 DIAGNOSIS — K635 Polyp of colon: Secondary | ICD-10-CM | POA: Diagnosis not present

## 2021-08-12 DIAGNOSIS — D122 Benign neoplasm of ascending colon: Secondary | ICD-10-CM | POA: Diagnosis not present

## 2021-08-12 DIAGNOSIS — Z885 Allergy status to narcotic agent status: Secondary | ICD-10-CM | POA: Diagnosis not present

## 2021-08-12 DIAGNOSIS — Z79899 Other long term (current) drug therapy: Secondary | ICD-10-CM | POA: Diagnosis not present

## 2021-08-12 DIAGNOSIS — D124 Benign neoplasm of descending colon: Secondary | ICD-10-CM | POA: Diagnosis not present

## 2021-08-12 DIAGNOSIS — K640 First degree hemorrhoids: Secondary | ICD-10-CM | POA: Insufficient documentation

## 2021-08-12 DIAGNOSIS — I130 Hypertensive heart and chronic kidney disease with heart failure and stage 1 through stage 4 chronic kidney disease, or unspecified chronic kidney disease: Secondary | ICD-10-CM | POA: Diagnosis not present

## 2021-08-12 HISTORY — PX: COLONOSCOPY WITH PROPOFOL: SHX5780

## 2021-08-12 HISTORY — DX: Diverticulitis of intestine, part unspecified, without perforation or abscess without bleeding: K57.92

## 2021-08-12 SURGERY — COLONOSCOPY WITH PROPOFOL
Anesthesia: General

## 2021-08-12 MED ORDER — PROPOFOL 500 MG/50ML IV EMUL
INTRAVENOUS | Status: DC | PRN
  Administered 2021-08-12: 150 ug/kg/min via INTRAVENOUS

## 2021-08-12 MED ORDER — SODIUM CHLORIDE 0.9 % IV SOLN: INTRAVENOUS | Status: DC

## 2021-08-12 MED ORDER — ONDANSETRON HCL 4 MG/2ML IJ SOLN
INTRAMUSCULAR | Status: DC | PRN
  Administered 2021-08-12: 4 mg via INTRAVENOUS

## 2021-08-12 MED ORDER — LIDOCAINE 2% (20 MG/ML) 5 ML SYRINGE
INTRAMUSCULAR | Status: DC | PRN
  Administered 2021-08-12: 50 mg via INTRAVENOUS

## 2021-08-12 MED ORDER — PROPOFOL 10 MG/ML IV BOLUS
INTRAVENOUS | Status: DC | PRN
  Administered 2021-08-12: 80 mg via INTRAVENOUS
  Administered 2021-08-12: 20 mg via INTRAVENOUS

## 2021-08-12 MED ORDER — INDOCYANINE GREEN 25 MG IV SOLR
5.0000 mg | Freq: Once | INTRAVENOUS | Status: AC
  Filled 2021-08-12: qty 10

## 2021-08-12 NOTE — Anesthesia Postprocedure Evaluation (Signed)
Anesthesia Post Note  Patient: Ann Parker  Procedure(s) Performed: COLONOSCOPY WITH PROPOFOL  Patient location during evaluation: PACU Anesthesia Type: General Level of consciousness: awake and alert Pain management: pain level controlled Vital Signs Assessment: post-procedure vital signs reviewed and stable Respiratory status: spontaneous breathing, nonlabored ventilation and respiratory function stable Cardiovascular status: blood pressure returned to baseline and stable Postop Assessment: no apparent nausea or vomiting Anesthetic complications: no   No notable events documented.   Last Vitals:  Vitals:   08/12/21 1119 08/12/21 1129  BP: (!) 145/63 (!) 150/69  Pulse: 60 (!) 57  Resp: 20 20  Temp:    SpO2: 100% 99%    Last Pain:  Vitals:   08/12/21 1129  TempSrc:   PainSc: 0-No pain                 Brett Canales Niko Penson

## 2021-08-12 NOTE — Transfer of Care (Signed)
Immediate Anesthesia Transfer of Care Note  Patient: Ann Parker  Procedure(s) Performed: COLONOSCOPY WITH PROPOFOL  Patient Location: Endoscopy Unit  Anesthesia Type:General  Level of Consciousness: awake, alert  and oriented  Airway & Oxygen Therapy: Patient Spontanous Breathing  Post-op Assessment: Report given to RN and Post -op Vital signs reviewed and stable  Post vital signs: Reviewed and stable  Last Vitals:  Vitals Value Taken Time  BP 159/62 08/12/21 1100  Temp 36.4 C 08/12/21 1059  Pulse 65 08/12/21 1101  Resp 27 08/12/21 1101  SpO2 98 % 08/12/21 1101  Vitals shown include unvalidated device data.  Last Pain:  Vitals:   08/12/21 1059  TempSrc: Temporal  PainSc: 0-No pain         Complications: No notable events documented.

## 2021-08-12 NOTE — Interval H&P Note (Signed)
History and Physical Interval Note:  08/12/2021 10:29 AM  Ann Parker  has presented today for surgery, with the diagnosis of Diverticulitis (K57.92) Bilateral lower abdominal pain (R10.31, R10.32).  The various methods of treatment have been discussed with the patient and family. After consideration of risks, benefits and other options for treatment, the patient has consented to  Procedure(s): COLONOSCOPY WITH PROPOFOL (N/A) as a surgical intervention.  The patient's history has been reviewed, patient examined, no change in status, stable for surgery.  I have reviewed the patient's chart and labs.  Questions were answered to the patient's satisfaction.     [name]  Ok to proceed with colonoscopy

## 2021-08-12 NOTE — Progress Notes (Signed)
Patient tolerated Sprite without any further complaints of nausea or vomiting and abdominal pain.  Stated, "the nausea has subsided and I feel fine."  Discharge instructions provided to the patient, including resuming her diet but to avoid any greasy, spicy, rich foods and to eat 'light' throughout the day.  She is aware that she can advance her diet as tolerated to her normal regular diet.  If she experiences any further nausea/vomiting, she will be in touch with Dr Haig Prophet.  Patient's daughter also given these same instructions.  Discharged to home.

## 2021-08-12 NOTE — Anesthesia Preprocedure Evaluation (Signed)
Anesthesia Evaluation  Patient identified by MRN, date of birth, ID band Patient awake    Reviewed: Allergy & Precautions, H&P , NPO status , Patient's Chart, lab work & pertinent test results  History of Anesthesia Complications Negative for: history of anesthetic complications  Airway Mallampati: II  TM Distance: >3 FB Neck ROM: full    Dental   Pulmonary neg sleep apnea, neg COPD, former smoker,    Pulmonary exam normal        Cardiovascular (-) angina+CHF  (-) Past MI and (-) Cardiac Stents negative cardio ROS Normal cardiovascular exam(-) dysrhythmias      Neuro/Psych PSYCHIATRIC DISORDERS Anxiety Depression negative neurological ROS     GI/Hepatic Neg liver ROS, GERD  ,  Endo/Other  Hypothyroidism obesity  Renal/GU negative Renal ROS  negative genitourinary   Musculoskeletal  (+) Fibromyalgia -  Abdominal   Peds  Hematology negative hematology ROS (+)   Anesthesia Other Findings Past Medical History: No date: Anxiety No date: Bilateral anterior knee pain 2000: Breast cancer ([place])     Comment:  left lumpectomy No date: CHF (congestive heart failure) (HCC) No date: CHF (congestive heart failure) (HCC) No date: Chronic insomnia No date: Depression No date: Diverticulitis No date: Dysphagia No date: Fibromyalgia No date: GERD (gastroesophageal reflux disease) No date: Hyperlipidemia No date: Hypothyroidism No date: Obesity, Class III, BMI 40-49.9 (morbid obesity) (HCC) No date: Pericardial effusion 2000: Personal history of chemotherapy     Comment:  left breast cancer 2000: Personal history of radiation therapy     Comment:  left breast cancer No date: Precordial pain No date: SOB (shortness of breath) No date: Urinary incontinence in female  Past Surgical History: 2000: BREAST BIOPSY; Left     Comment:  Positive 2000: BREAST LUMPECTOMY; Left 2000: BREAST LUMPECTOMY WITH AXILLARY LYMPH NODE  DISSECTION; Left No date: BREAST SURGERY No date: CHOLECYSTECTOMY No date: CORONARY ARTERY BYPASS GRAFT 01/08/2017: ESOPHAGOGASTRODUODENOSCOPY (EGD) WITH PROPOFOL; N/A     Comment:  Procedure: ESOPHAGOGASTRODUODENOSCOPY (EGD) WITH               PROPOFOL;  Surgeon: Manya Silvas, MD;  Location: [place];  Service: Endoscopy;  Laterality: N/A; No date: NECK SURGERY  BMI    Body Mass Index: 36.81 kg/m      Reproductive/Obstetrics negative OB ROS                             Anesthesia Physical Anesthesia Plan  ASA: 3  Anesthesia Plan: General   Post-op Pain Management:    Induction:   PONV Risk Score and Plan: Propofol infusion and TIVA  Airway Management Planned:   Additional Equipment:   Intra-op Plan:   Post-operative Plan:   Informed Consent: I have reviewed the patients History and Physical, chart, labs and discussed the procedure including the risks, benefits and alternatives for the proposed anesthesia with the patient or authorized representative who has indicated his/her understanding and acceptance.     Dental Advisory Given  Plan Discussed with: Anesthesiologist, CRNA and Surgeon  Anesthesia Plan Comments:         Anesthesia Quick Evaluation

## 2021-08-12 NOTE — H&P (Signed)
Subjective:   CC: Diverticulitis [K57.92]   HPI:  Ann Parker is a 70 y.o. female who was referred by Velna Ochs, MD for evaluation of above. First noted several months ago.  Symptoms include: Pain is dull, waxing and waning, with no period of complete pain relief since first episode earlier this year despite multiple abx use.  Exacerbated by nothing specific.  Alleviated by abx slightly but never completely resolves.  Associated with nothing specific.Marland Kitchen     Past Medical History:  has a past medical history of Anxiety (2001), Asthma without status asthmaticus, unspecified (2003), Breast cancer (CMS-HCC) (2000), CHF (congestive heart failure) (CMS-HCC) (2000), Fibromyalgia, GERD (gastroesophageal reflux disease) (2002), and Hyperlipidemia.   Past Surgical History:  has a past surgical history that includes Mastectomy partial / lumpectomy (2000); neck surgery (2006); Coronary artery bypass graft (2000); and egd (01/08/2017).   Family History: family history includes Diabetes type II in her mother and sister; Myocardial Infarction (Heart attack) in her father.   Social History:  reports that she quit smoking about 42 years ago. Her smoking use included cigarettes. She has never used smokeless tobacco. She reports that she does not drink alcohol and does not use drugs.   Current Medications: has a current medication list which includes the following prescription(s): albuterol, budesonide-formoterol, ciprofloxacin hcl, citalopram, dicyclomine, ezetimibe, metoprolol succinate, metronidazole, ondansetron, potassium chloride, pregabalin, entresto, tramadol, trazodone, lovastatin, oxybutynin, sodium, potassium, and magnesium, and tolterodine.   Allergies:  Allergies Allergen Reactions  Codeine Other (See Comments)     "Skin Crawls"  Nyquil [Doxylamin-Pse-Dm-Acetaminophen] Other (See Comments)     Skin Crawls  Pravastatin Muscle Pain  Seroquel [Quetiapine] Other (See Comments)     Skin  Crawl     ROS:  A 15 point review of systems was performed and pertinent positives and negatives noted in HPI   Objective:   BP 114/75   Pulse 67   Ht 170.2 cm (5\' 7" )   Wt (!) 110.7 kg (244 lb)   BMI 38.22 kg/m    Constitutional :  alert, appears stated age, cooperative and no distress Lymphatics/Throat:  no asymmetry, masses, or scars Respiratory:  clear to auscultation bilaterally Cardiovascular:  regular rate and rhythm Gastrointestinal: soft, no guarding, focal TTP LLQ.   Musculoskeletal: Steady gait and movement Skin: Cool and moist Psychiatric: Normal affect, non-agitated, not confused       LABS:  N/a   RADS: CLINICAL DATA:  Left lower quadrant pain   EXAM:  CT ABDOMEN AND PELVIS WITH CONTRAST   TECHNIQUE:  Multidetector CT imaging of the abdomen and pelvis was performed  using the standard protocol following bolus administration of  intravenous contrast.   CONTRAST:  9mL OMNIPAQUE IOHEXOL 350 MG/ML SOLN   COMPARISON:  CT abdomen and pelvis dated Feb 14, 2021   FINDINGS:  Lower chest: Incompletely imaged indeterminate rounded area of  enhancement seen in the upper right atrium. No acute findings in the  upper chest. Stable small 2 mm pulmonary nodule seen in the left  lower lobe on series 2 image 12.   Hepatobiliary: No focal liver abnormality is seen. Status post  cholecystectomy. No biliary dilatation.   Pancreas: Unremarkable. No pancreatic ductal dilatation or  surrounding inflammatory changes.   Spleen: Normal in size without focal abnormality.   Adrenals/Urinary Tract: Bilateral adrenal glands are unremarkable.  No hydronephrosis of the bilateral kidneys. Bilateral low-density  renal lesions, largest are compatible with simple cysts, others are  too small to completely characterize.  Mild fat stranding adjacent to  the anterior bladder wall, likely reactive.   Stomach/Bowel: Wall thickening adjacent inflammatory change of the  sigmoid  colon. Numerous sigmoid diverticula are seen no fluid  collections or free intraperitoneal air.   Vascular/Lymphatic: No significant vascular findings are present. No  enlarged abdominal or pelvic lymph nodes.   Reproductive: Calcifications are seen within the uterus, likely due  to calcified fibroids.   Other: No abdominal wall hernia or abnormality. No abdominopelvic  ascites.   Musculoskeletal: No acute or significant osseous findings.   IMPRESSION:  Findings compatible with acute uncomplicated diverticulitis of the  sigmoid colon.   Incompletely imaged indeterminate rounded area of enhancement seen  in the upper right atrium, possibly related to mixing artifact,  although cardiac mass could have a similar appearance. Finding could  be further evaluated with echocardiography.   These results will be called to the ordering clinician or  representative by the Radiologist Assistant, and communication  documented in the PACS or [employer].    Electronically Signed    By: Yetta Glassman MD    On: 05/11/2021 14:30     Assessment:      Diverticulitis [K57.92] recurrent/persistent despite outpt abx.  Workup only shows no perforation or clinical exams/labs concerning for severe diverticulitis requiring hospitalization.  Due to persistent nature, pt requesting more definitive surgical intervention.   CHF CKD- stage 3 obesity   Plan:     The risk of laparoscopic colon resection surgery includes, but not limited to, recurrence, bleeding, chronic pain, post-op infxn, post-op SBO or ileus, hernias, resection of bowel, re-anastamosis, possible ostomy placement and need for re-operation to address said risks. The risks of general anesthetic, if used, includes MI, CVA, sudden death or even reaction to anesthetic medications also discussed. Alternatives include continued observation.  Benefits include possible symptom relief, preventing further decline in health and possible  death.   Typical post-op recovery time of additional days in hospital for observation afterwards also discussed.   Prep ordered.  Will proceed with ERAS protocol as well.  Pending medical clearance and workup as noted above.     The patient verbalized understanding and all questions were answered to the patient's satisfaction.   Will see if latest abx use can minimize inflammation as much as possible prior to colonsocopy and subsequent surgery.  Colonoscopy scheduled for November.  Will need risk stratification prior to that as well.  She is aware she is at high risk for perioperative complications due to comorbidities, but still wishes to proceed.   We discussed continuing to lose weight with diet control and strict return precautions in the meantime.  IF symptoms worsen, may need to consider more urgent surgery.  She verbalized understanding.

## 2021-08-12 NOTE — H&P (Signed)
Outpatient short stay form Pre-procedure 08/12/2021  Ann Rubenstein, MD  Primary Physician: Baxter Hire, MD  Reason for visit:  Hx of diverticulitis  History of present illness:   70 y/o lady with history of fibromyalgia, HFrEF (EF 40%), hypertension, and CKD here for colonoscopy due to recurrent sigmoid diverticulitis confirmed by CT. Unknown time of last colonoscopy. No blood thinners. History of cholecystectomy. No family history of GI malignancies.    Current Facility-Administered Medications:    0.9 %  sodium chloride infusion, , Intravenous, Continuous, Amilee Janvier, Hilton Cork, MD, Last Rate: 20 mL/hr at 08/12/21 0958, Continued from Pre-op at 08/12/21 0958  Medications Prior to Admission  Medication Sig Dispense Refill Last Dose   albuterol (PROVENTIL HFA;VENTOLIN HFA) 108 (90 Base) MCG/ACT inhaler Inhale 1-2 puffs into the lungs every 6 (six) hours as needed for wheezing or shortness of breath.   08/11/2021   budesonide-formoterol (SYMBICORT) 160-4.5 MCG/ACT inhaler Inhale 2 puffs into the lungs 2 (two) times daily as needed (wheezing/shortness of breath).   08/11/2021   citalopram (CELEXA) 40 MG tablet Take 40 mg by mouth in the morning.   08/11/2021   ENTRESTO 24-26 MG Take 1 tablet by mouth 2 (two) times daily.   08/11/2021   ezetimibe (ZETIA) 10 MG tablet Take 10 mg by mouth in the morning.   08/11/2021   furosemide (LASIX) 40 MG tablet Take 40 mg by mouth daily as needed (fluid retention.).   Past Week   metoprolol succinate (TOPROL-XL) 50 MG 24 hr tablet Take 50 mg by mouth in the morning. Take with or immediately following a meal.   08/11/2021   potassium chloride (K-DUR,KLOR-CON) 10 MEQ tablet Take 10 mEq by mouth in the morning.   08/11/2021   pregabalin (LYRICA) 150 MG capsule Take 150 mg by mouth 3 (three) times daily.   08/11/2021   rosuvastatin (CRESTOR) 5 MG tablet Take 5 mg by mouth in the morning.   08/11/2021   SUPREP BOWEL PREP KIT 17.5-3.13-1.6 GM/177ML SOLN Take 1  Bottle by mouth as directed.   08/12/2021   traMADol (ULTRAM) 50 MG tablet Take 1 tablet (50 mg total) by mouth every 6 (six) hours as needed. 15 tablet 0 Past Month   traZODone (DESYREL) 150 MG tablet Take 150 mg by mouth at bedtime.   Past Week   dicyclomine (BENTYL) 10 MG capsule Take 1 capsule (10 mg total) by mouth 4 (four) times daily for 5 days. (Patient not taking: Reported on 08/12/2021) 20 capsule 0 Not Taking   meclizine (ANTIVERT) 25 MG tablet Take 1 tablet (25 mg total) by mouth 3 (three) times daily as needed for dizziness. (Patient not taking: Reported on 08/12/2021) 30 tablet 0 Not Taking     Allergies  Allergen Reactions   Codeine Itching and Other (See Comments)    Sensation of something crawling all over her   Nyquil Hbp Cold & Flu [Dm-Doxylamine-Acetaminophen] Other (See Comments)   Pravastatin Other (See Comments)    Muscle Pain   Seroquel [Quetiapine] Other (See Comments)     Past Medical History:  Diagnosis Date   Anxiety    Bilateral anterior knee pain    Breast cancer ([place]) 2000   left lumpectomy   CHF (congestive heart failure) (HCC)    CHF (congestive heart failure) (HCC)    Chronic insomnia    Depression    Diverticulitis    Dysphagia    Fibromyalgia    GERD (gastroesophageal reflux disease)    Hyperlipidemia  Hypothyroidism    Obesity, Class III, BMI 40-49.9 (morbid obesity) (HCC)    Pericardial effusion    Personal history of chemotherapy 2000   left breast cancer   Personal history of radiation therapy 2000   left breast cancer   Precordial pain    SOB (shortness of breath)    Urinary incontinence in female     Review of systems:  Otherwise negative.    Physical Exam  Gen: Alert, oriented. Appears stated age.  HEENT: PERRLA. Lungs: No respiratory distress CV: RRR Abd: soft, benign, no masses Ext: No edema    Planned procedures: Proceed with colonoscopy. The patient understands the nature of the planned procedure, indications,  risks, alternatives and potential complications including but not limited to bleeding, infection, perforation, damage to internal organs and possible oversedation/side effects from anesthesia. The patient agrees and gives consent to proceed.  Please refer to procedure notes for findings, recommendations and patient disposition/instructions.     Ann Rubenstein, MD [facility]

## 2021-08-12 NOTE — H&P (View-Only) (Signed)
Subjective:   CC: Diverticulitis [K57.92]   HPI:  Ann Parker is a 70 y.o. female who was referred by Velna Ochs, MD for evaluation of above. First noted several months ago.  Symptoms include: Pain is dull, waxing and waning, with no period of complete pain relief since first episode earlier this year despite multiple abx use.  Exacerbated by nothing specific.  Alleviated by abx slightly but never completely resolves.  Associated with nothing specific.Marland Kitchen     Past Medical History:  has a past medical history of Anxiety (2001), Asthma without status asthmaticus, unspecified (2003), Breast cancer (CMS-HCC) (2000), CHF (congestive heart failure) (CMS-HCC) (2000), Fibromyalgia, GERD (gastroesophageal reflux disease) (2002), and Hyperlipidemia.   Past Surgical History:  has a past surgical history that includes Mastectomy partial / lumpectomy (2000); neck surgery (2006); Coronary artery bypass graft (2000); and egd (01/08/2017).   Family History: family history includes Diabetes type II in her mother and sister; Myocardial Infarction (Heart attack) in her father.   Social History:  reports that she quit smoking about 42 years ago. Her smoking use included cigarettes. She has never used smokeless tobacco. She reports that she does not drink alcohol and does not use drugs.   Current Medications: has a current medication list which includes the following prescription(s): albuterol, budesonide-formoterol, ciprofloxacin hcl, citalopram, dicyclomine, ezetimibe, metoprolol succinate, metronidazole, ondansetron, potassium chloride, pregabalin, entresto, tramadol, trazodone, lovastatin, oxybutynin, sodium, potassium, and magnesium, and tolterodine.   Allergies:  Allergies Allergen Reactions  Codeine Other (See Comments)     "Skin Crawls"  Nyquil [Doxylamin-Pse-Dm-Acetaminophen] Other (See Comments)     Skin Crawls  Pravastatin Muscle Pain  Seroquel [Quetiapine] Other (See Comments)     Skin  Crawl     ROS:  A 15 point review of systems was performed and pertinent positives and negatives noted in HPI   Objective:   BP 114/75   Pulse 67   Ht 170.2 cm (5\' 7" )   Wt (!) 110.7 kg (244 lb)   BMI 38.22 kg/m    Constitutional :  alert, appears stated age, cooperative and no distress Lymphatics/Throat:  no asymmetry, masses, or scars Respiratory:  clear to auscultation bilaterally Cardiovascular:  regular rate and rhythm Gastrointestinal: soft, no guarding, focal TTP LLQ.   Musculoskeletal: Steady gait and movement Skin: Cool and moist Psychiatric: Normal affect, non-agitated, not confused       LABS:  N/a   RADS: CLINICAL DATA:  Left lower quadrant pain   EXAM:  CT ABDOMEN AND PELVIS WITH CONTRAST   TECHNIQUE:  Multidetector CT imaging of the abdomen and pelvis was performed  using the standard protocol following bolus administration of  intravenous contrast.   CONTRAST:  87mL OMNIPAQUE IOHEXOL 350 MG/ML SOLN   COMPARISON:  CT abdomen and pelvis dated Feb 14, 2021   FINDINGS:  Lower chest: Incompletely imaged indeterminate rounded area of  enhancement seen in the upper right atrium. No acute findings in the  upper chest. Stable small 2 mm pulmonary nodule seen in the left  lower lobe on series 2 image 12.   Hepatobiliary: No focal liver abnormality is seen. Status post  cholecystectomy. No biliary dilatation.   Pancreas: Unremarkable. No pancreatic ductal dilatation or  surrounding inflammatory changes.   Spleen: Normal in size without focal abnormality.   Adrenals/Urinary Tract: Bilateral adrenal glands are unremarkable.  No hydronephrosis of the bilateral kidneys. Bilateral low-density  renal lesions, largest are compatible with simple cysts, others are  too small to completely characterize.  Mild fat stranding adjacent to  the anterior bladder wall, likely reactive.   Stomach/Bowel: Wall thickening adjacent inflammatory change of the  sigmoid  colon. Numerous sigmoid diverticula are seen no fluid  collections or free intraperitoneal air.   Vascular/Lymphatic: No significant vascular findings are present. No  enlarged abdominal or pelvic lymph nodes.   Reproductive: Calcifications are seen within the uterus, likely due  to calcified fibroids.   Other: No abdominal wall hernia or abnormality. No abdominopelvic  ascites.   Musculoskeletal: No acute or significant osseous findings.   IMPRESSION:  Findings compatible with acute uncomplicated diverticulitis of the  sigmoid colon.   Incompletely imaged indeterminate rounded area of enhancement seen  in the upper right atrium, possibly related to mixing artifact,  although cardiac mass could have a similar appearance. Finding could  be further evaluated with echocardiography.   These results will be called to the ordering clinician or  representative by the Radiologist Assistant, and communication  documented in the PACS or [employer].    Electronically Signed    By: Yetta Glassman MD    On: 05/11/2021 14:30     Assessment:      Diverticulitis [K57.92] recurrent/persistent despite outpt abx.  Workup only shows no perforation or clinical exams/labs concerning for severe diverticulitis requiring hospitalization.  Due to persistent nature, pt requesting more definitive surgical intervention.   CHF CKD- stage 3 obesity   Plan:     The risk of laparoscopic colon resection surgery includes, but not limited to, recurrence, bleeding, chronic pain, post-op infxn, post-op SBO or ileus, hernias, resection of bowel, re-anastamosis, possible ostomy placement and need for re-operation to address said risks. The risks of general anesthetic, if used, includes MI, CVA, sudden death or even reaction to anesthetic medications also discussed. Alternatives include continued observation.  Benefits include possible symptom relief, preventing further decline in health and possible  death.   Typical post-op recovery time of additional days in hospital for observation afterwards also discussed.   Prep ordered.  Will proceed with ERAS protocol as well.  Pending medical clearance and workup as noted above.     The patient verbalized understanding and all questions were answered to the patient's satisfaction.   Will see if latest abx use can minimize inflammation as much as possible prior to colonsocopy and subsequent surgery.  Colonoscopy scheduled for November.  Will need risk stratification prior to that as well.  She is aware she is at high risk for perioperative complications due to comorbidities, but still wishes to proceed.   We discussed continuing to lose weight with diet control and strict return precautions in the meantime.  IF symptoms worsen, may need to consider more urgent surgery.  She verbalized understanding.

## 2021-08-12 NOTE — Op Note (Signed)
[facility] Gastroenterology Patient Name: Ann Parker Procedure Date: 08/12/2021 10:16 AM MRN: [id number] Account #: [id number] Date of Birth: [birth year] Admit Type: Outpatient Age: 70 Room: [facility] ENDO ROOM 3 Gender: Female Note Status: Finalized Instrument Name: Park Meo [id number] Procedure:             Colonoscopy Indications:           Diverticulitis Providers:             Andrey Farmer MD, MD Medicines:             Monitored Anesthesia Care Complications:         No immediate complications. Estimated blood loss:                         Minimal. Procedure:             Pre-Anesthesia Assessment:                        - Prior to the procedure, a History and Physical was                         performed, and patient medications and allergies were                         reviewed. The patient is competent. The risks and                         benefits of the procedure and the sedation options and                         risks were discussed with the patient. All questions                         were answered and informed consent was obtained.                         Patient identification and proposed procedure were                         verified by the physician, the nurse, the anesthetist                         and the technician in the endoscopy suite. Mental                         Status Examination: alert and oriented. Airway                         Examination: normal oropharyngeal airway and neck                         mobility. Respiratory Examination: clear to                         auscultation. CV Examination: normal. Prophylactic                         Antibiotics: The patient does not require prophylactic  antibiotics. Prior Anticoagulants: The patient has                         taken no previous anticoagulant or antiplatelet                         agents. ASA Grade Assessment: III - A patient with                          severe systemic disease. After reviewing the risks and                         benefits, the patient was deemed in satisfactory                         condition to undergo the procedure. The anesthesia                         plan was to use monitored anesthesia care (MAC).                         Immediately prior to administration of medications,                         the patient was re-assessed for adequacy to receive                         sedatives. The heart rate, respiratory rate, oxygen                         saturations, blood pressure, adequacy of pulmonary                         ventilation, and response to care were monitored                         throughout the procedure. The physical status of the                         patient was re-assessed after the procedure.                        After obtaining informed consent, the colonoscope was                         passed under direct vision. Throughout the procedure,                         the patient's blood pressure, pulse, and oxygen                         saturations were monitored continuously. The                         Colonoscope was introduced through the anus and                         advanced to the the cecum, identified by appendiceal  orifice and ileocecal valve. The colonoscopy was                         somewhat difficult due to inadequate bowel prep. The                         patient tolerated the procedure well. The quality of                         the bowel preparation was inadequate. Findings:      The perianal and digital rectal examinations were normal.      A 8 mm polyp was found in the ascending colon. The polyp was       semi-pedunculated. The polyp was removed with a cold snare. Resection       and retrieval were complete. Estimated blood loss was minimal.      A 5 mm polyp was found in the descending colon. The polyp was sessile.       The polyp was  removed with a cold snare. Resection and retrieval were       complete. Estimated blood loss was minimal.      A 2 mm polyp was found in the sigmoid colon. The polyp was sessile. The       polyp was removed with a cold snare. Resection was complete, but the       polyp tissue was not retrieved. Estimated blood loss was minimal.      Many small and large-mouthed diverticula were found in the sigmoid colon       and descending colon.      Internal hemorrhoids were found during retroflexion. The hemorrhoids       were Grade I (internal hemorrhoids that do not prolapse).      The exam was otherwise without abnormality on direct and retroflexion       views. Impression:            - Preparation of the colon was inadequate.                        - One 8 mm polyp in the ascending colon, removed with                         a cold snare. Resected and retrieved.                        - One 5 mm polyp in the descending colon, removed with                         a cold snare. Resected and retrieved.                        - One 2 mm polyp in the sigmoid colon, removed with a                         cold snare. Complete resection. Polyp tissue not                         retrieved.                        -  Diverticulosis in the sigmoid colon and in the                         descending colon. Predominantly in sigmoid colon.                        - Internal hemorrhoids.                        - The examination was otherwise normal on direct and                         retroflexion views. Recommendation:        - Discharge patient to home.                        - Resume previous diet.                        - Continue present medications.                        - Await pathology results.                        - Repeat colonoscopy in 6-12 months because the bowel                         preparation was suboptimal. No mass lesions seen but                         small polyps could have been  missed.                        - Return to referring physician as previously                         scheduled. Procedure Code(s):     --- Professional ---                        [phone], Colonoscopy, flexible; with removal of                         tumor(s), polyp(s), or other lesion(s) by snare                         technique Diagnosis Code(s):     --- Professional ---                        K64.0, First degree hemorrhoids                        K63.5, Polyp of colon                        K57.32, Diverticulitis of large intestine without                         perforation or abscess without bleeding  K57.30, Diverticulosis of large intestine without                         perforation or abscess without bleeding CPT copyright 2019 [employer]. All rights reserved. The codes documented in this report are preliminary and upon coder review may  be revised to meet current compliance requirements. Andrey Farmer MD, MD 08/12/2021 10:59:46 AM Number of Addenda: 0 Note Initiated On: 08/12/2021 10:16 AM Scope Withdrawal Time: 0 hours 11 minutes 14 seconds  Total Procedure Duration: 0 hours 17 minutes 24 seconds  Estimated Blood Loss:  Estimated blood loss was minimal.      [facility]

## 2021-08-13 NOTE — Progress Notes (Signed)
Voicemail.  No message Left.

## 2021-08-15 ENCOUNTER — Other Ambulatory Visit: Payer: Self-pay

## 2021-08-15 ENCOUNTER — Encounter
Admission: RE | Admit: 2021-08-15 | Discharge: 2021-08-15 | Disposition: A | Discharge status: Home / Self Care | Payer: Medicare HMO | Source: Ambulatory Visit | Attending: Surgery | Admitting: Surgery

## 2021-08-15 ENCOUNTER — Encounter: Payer: Self-pay | Admitting: Gastroenterology

## 2021-08-15 VITALS — Ht 67.0 in | Wt 240.0 lb

## 2021-08-15 DIAGNOSIS — Z6841 Body Mass Index (BMI) 40.0 and over, adult: Secondary | ICD-10-CM

## 2021-08-15 DIAGNOSIS — I5022 Chronic systolic (congestive) heart failure: Secondary | ICD-10-CM

## 2021-08-15 DIAGNOSIS — I3139 Other pericardial effusion (noninflammatory): Secondary | ICD-10-CM

## 2021-08-15 DIAGNOSIS — E782 Mixed hyperlipidemia: Secondary | ICD-10-CM

## 2021-08-15 DIAGNOSIS — Z951 Presence of aortocoronary bypass graft: Secondary | ICD-10-CM

## 2021-08-15 DIAGNOSIS — Z79899 Other long term (current) drug therapy: Secondary | ICD-10-CM

## 2021-08-15 HISTORY — DX: Dyspnea, unspecified: R06.00

## 2021-08-15 LAB — SURGICAL PATHOLOGY

## 2021-08-15 NOTE — Patient Instructions (Signed)
Your procedure is scheduled on: 08/25/21 Report to [facility]. To find out your arrival time please call [phone] between 1PM - 3PM on 08/24/21.  Remember: Instructions that are not followed completely may result in serious medical risk, up to and including death, or upon the discretion of your surgeon and anesthesiologist your surgery may need to be rescheduled.     _X__ 1. Diet as instructed by Dr Lysle Pearl  __X__2.  On the morning of surgery brush your teeth with toothpaste and water, you                 may rinse your mouth with mouthwash if you wish.  Do not swallow any              toothpaste of mouthwash.     _X__ 3.  No Alcohol for 24 hours before or after surgery.   _X__ 4.  Do Not Smoke or use e-cigarettes For 24 Hours Prior to Your Surgery.                 Do not use any chewable tobacco products for at least 6 hours prior to                 surgery.  ____  5.  Bring all medications with you on the day of surgery if instructed.   __X__  6.  Notify your doctor if there is any change in your medical condition      (cold, fever, infections).     Do not wear jewelry, make-up, hairpins, clips or nail polish. Do not wear lotions, powders, or perfumes.  Do not shave body hair 48 hours prior to surgery. Men may shave face and neck. Do not bring valuables to the hospital.    [facility] is not responsible for any belongings or valuables.  Contacts, dentures/partials or body piercings may not be worn into surgery. Bring a case for your contacts, glasses or hearing aids, a denture cup will be supplied. Leave your suitcase in the car. After surgery it may be brought to your room. For patients admitted to the hospital, discharge time is determined by your treatment team.   Patients discharged the day of surgery will not be allowed to drive home.   Please read over the following fact sheets that you were given:   CHG  soap  __X__ Take these medicines the morning of surgery with A SIP OF WATER:    1. citalopram (CELEXA) 40 MG tablet  2. ezetimibe (ZETIA) 10 MG tablet  3. metoprolol succinate (TOPROL-XL) 50 MG 24 hr tablet  4. pregabalin (LYRICA) 150 MG capsule  5. rosuvastatin (CRESTOR) 5 MG tablet  6.  ____ Fleet Enema (as directed)   __X__ Use CHG Soap/SAGE wipes as directed  __X__ Use inhalers on the day of surgery  ____ Stop metformin/Janumet/Farxiga 2 days prior to surgery    ____ Take 1/2 of usual insulin dose the night before surgery. No insulin the morning          of surgery.   ____ Stop Blood Thinners Coumadin/Plavix/Xarelto/Pleta/Pradaxa/Eliquis/Effient/Aspirin  on   Or contact your Surgeon, Cardiologist or Medical Doctor regarding  ability to stop your blood thinners  __X__ Stop Anti-inflammatories 7 days before surgery such as Advil, Ibuprofen, Motrin,  BC or Goodies Powder, Naprosyn, Naproxen, Aleve, Aspirin    __X__ Stop all herbals and supplements, fish oil and vitamins  until after surgery.  ____ Bring C-Pap to the hospital.

## 2021-08-16 ENCOUNTER — Other Ambulatory Visit
Admission: RE | Admit: 2021-08-16 | Discharge: 2021-08-16 | Disposition: A | Discharge status: Home / Self Care | Payer: Medicare HMO | Source: Ambulatory Visit | Attending: Surgery | Admitting: Surgery

## 2021-08-16 DIAGNOSIS — Z951 Presence of aortocoronary bypass graft: Secondary | ICD-10-CM | POA: Diagnosis not present

## 2021-08-16 DIAGNOSIS — Z6841 Body Mass Index (BMI) 40.0 and over, adult: Secondary | ICD-10-CM | POA: Diagnosis not present

## 2021-08-16 DIAGNOSIS — I5022 Chronic systolic (congestive) heart failure: Secondary | ICD-10-CM | POA: Diagnosis not present

## 2021-08-16 DIAGNOSIS — I3139 Other pericardial effusion (noninflammatory): Secondary | ICD-10-CM | POA: Insufficient documentation

## 2021-08-16 DIAGNOSIS — E782 Mixed hyperlipidemia: Secondary | ICD-10-CM | POA: Diagnosis not present

## 2021-08-16 DIAGNOSIS — Z79899 Other long term (current) drug therapy: Secondary | ICD-10-CM | POA: Diagnosis not present

## 2021-08-16 LAB — CBC
HCT: 37.6 % (ref 36.0–46.0)
Hemoglobin: 12.5 g/dL (ref 12.0–15.0)
MCH: 27.2 pg (ref 26.0–34.0)
MCHC: 33.2 g/dL (ref 30.0–36.0)
MCV: 81.9 fL (ref 80.0–100.0)
Platelets: 138 10*3/uL — ABNORMAL LOW (ref 150–400)
RBC: 4.59 MIL/uL (ref 3.87–5.11)
RDW: 15.9 % — ABNORMAL HIGH (ref 11.5–15.5)
WBC: 7.9 10*3/uL (ref 4.0–10.5)
nRBC: 0 % (ref 0.0–0.2)

## 2021-08-16 LAB — BASIC METABOLIC PANEL
Anion gap: 8 (ref 5–15)
BUN: 19 mg/dL (ref 8–23)
CO2: 27 mmol/L (ref 22–32)
Calcium: 9.2 mg/dL (ref 8.9–10.3)
Chloride: 104 mmol/L (ref 98–111)
Creatinine, Ser: 1.13 mg/dL — ABNORMAL HIGH (ref 0.44–1.00)
GFR, Estimated: 52 mL/min — ABNORMAL LOW (ref >60)
Glucose, Bld: 80 mg/dL (ref 70–99)
Potassium: 4.3 mmol/L (ref 3.5–5.1)
Sodium: 139 mmol/L (ref 135–145)

## 2021-08-23 ENCOUNTER — Other Ambulatory Visit
Admission: RE | Admit: 2021-08-23 | Discharge: 2021-08-23 | Disposition: A | Discharge status: Home / Self Care | Payer: Medicare HMO | Source: Ambulatory Visit | Attending: Surgery | Admitting: Surgery

## 2021-08-23 ENCOUNTER — Other Ambulatory Visit: Payer: Self-pay

## 2021-08-23 DIAGNOSIS — Z9010 Acquired absence of unspecified breast and nipple: Secondary | ICD-10-CM | POA: Diagnosis not present

## 2021-08-23 DIAGNOSIS — K5730 Diverticulosis of large intestine without perforation or abscess without bleeding: Secondary | ICD-10-CM | POA: Diagnosis not present

## 2021-08-23 DIAGNOSIS — D259 Leiomyoma of uterus, unspecified: Secondary | ICD-10-CM | POA: Diagnosis present

## 2021-08-23 DIAGNOSIS — Z87891 Personal history of nicotine dependence: Secondary | ICD-10-CM | POA: Diagnosis not present

## 2021-08-23 DIAGNOSIS — Z01812 Encounter for preprocedural laboratory examination: Secondary | ICD-10-CM | POA: Insufficient documentation

## 2021-08-23 DIAGNOSIS — Z833 Family history of diabetes mellitus: Secondary | ICD-10-CM | POA: Diagnosis not present

## 2021-08-23 DIAGNOSIS — Z20822 Contact with and (suspected) exposure to covid-19: Secondary | ICD-10-CM

## 2021-08-23 DIAGNOSIS — F419 Anxiety disorder, unspecified: Secondary | ICD-10-CM | POA: Diagnosis present

## 2021-08-23 DIAGNOSIS — I509 Heart failure, unspecified: Secondary | ICD-10-CM | POA: Diagnosis present

## 2021-08-23 DIAGNOSIS — Z79891 Long term (current) use of opiate analgesic: Secondary | ICD-10-CM | POA: Diagnosis not present

## 2021-08-23 DIAGNOSIS — Z8249 Family history of ischemic heart disease and other diseases of the circulatory system: Secondary | ICD-10-CM | POA: Diagnosis not present

## 2021-08-23 DIAGNOSIS — Z6838 Body mass index (BMI) 38.0-38.9, adult: Secondary | ICD-10-CM | POA: Diagnosis not present

## 2021-08-23 DIAGNOSIS — K5792 Diverticulitis of intestine, part unspecified, without perforation or abscess without bleeding: Secondary | ICD-10-CM | POA: Diagnosis not present

## 2021-08-23 DIAGNOSIS — Z951 Presence of aortocoronary bypass graft: Secondary | ICD-10-CM | POA: Diagnosis not present

## 2021-08-23 DIAGNOSIS — M79652 Pain in left thigh: Secondary | ICD-10-CM | POA: Diagnosis not present

## 2021-08-23 DIAGNOSIS — Z7951 Long term (current) use of inhaled steroids: Secondary | ICD-10-CM | POA: Diagnosis not present

## 2021-08-23 DIAGNOSIS — Z886 Allergy status to analgesic agent status: Secondary | ICD-10-CM | POA: Diagnosis not present

## 2021-08-23 DIAGNOSIS — Z885 Allergy status to narcotic agent status: Secondary | ICD-10-CM | POA: Diagnosis not present

## 2021-08-23 DIAGNOSIS — H5712 Ocular pain, left eye: Secondary | ICD-10-CM | POA: Diagnosis not present

## 2021-08-23 DIAGNOSIS — K5732 Diverticulitis of large intestine without perforation or abscess without bleeding: Secondary | ICD-10-CM | POA: Diagnosis present

## 2021-08-23 DIAGNOSIS — E669 Obesity, unspecified: Secondary | ICD-10-CM | POA: Diagnosis present

## 2021-08-23 DIAGNOSIS — K219 Gastro-esophageal reflux disease without esophagitis: Secondary | ICD-10-CM | POA: Diagnosis not present

## 2021-08-23 DIAGNOSIS — N183 Chronic kidney disease, stage 3 unspecified: Secondary | ICD-10-CM | POA: Diagnosis present

## 2021-08-23 DIAGNOSIS — Z79899 Other long term (current) drug therapy: Secondary | ICD-10-CM | POA: Diagnosis not present

## 2021-08-23 DIAGNOSIS — H169 Unspecified keratitis: Secondary | ICD-10-CM | POA: Diagnosis present

## 2021-08-23 DIAGNOSIS — S0500XA Injury of conjunctiva and corneal abrasion without foreign body, unspecified eye, initial encounter: Secondary | ICD-10-CM | POA: Diagnosis not present

## 2021-08-23 DIAGNOSIS — H269 Unspecified cataract: Secondary | ICD-10-CM | POA: Diagnosis present

## 2021-08-23 DIAGNOSIS — Z888 Allergy status to other drugs, medicaments and biological substances status: Secondary | ICD-10-CM | POA: Diagnosis not present

## 2021-08-23 DIAGNOSIS — J45909 Unspecified asthma, uncomplicated: Secondary | ICD-10-CM | POA: Diagnosis present

## 2021-08-24 LAB — SARS CORONAVIRUS 2 (TAT 6-24 HRS): SARS Coronavirus 2: NEGATIVE

## 2021-08-25 ENCOUNTER — Inpatient Hospital Stay: Payer: Medicare HMO | Admitting: Certified Registered Nurse Anesthetist

## 2021-08-25 ENCOUNTER — Other Ambulatory Visit: Payer: Self-pay

## 2021-08-25 ENCOUNTER — Encounter
Admission: RE | Disposition: A | Discharge status: Home / Self Care | Payer: Self-pay | Source: Home / Self Care | Attending: Surgery

## 2021-08-25 ENCOUNTER — Encounter: Payer: Self-pay | Admitting: Surgery

## 2021-08-25 ENCOUNTER — Inpatient Hospital Stay
Admission: RE | Admit: 2021-08-25 | Discharge: 2021-08-29 | DRG: 331 | Disposition: A | Discharge status: Home / Self Care | Payer: Medicare HMO | Attending: Surgery | Admitting: Surgery

## 2021-08-25 DIAGNOSIS — Z20822 Contact with and (suspected) exposure to covid-19: Secondary | ICD-10-CM | POA: Diagnosis present

## 2021-08-25 DIAGNOSIS — M79652 Pain in left thigh: Secondary | ICD-10-CM | POA: Diagnosis not present

## 2021-08-25 DIAGNOSIS — Z79891 Long term (current) use of opiate analgesic: Secondary | ICD-10-CM | POA: Diagnosis not present

## 2021-08-25 DIAGNOSIS — J45909 Unspecified asthma, uncomplicated: Secondary | ICD-10-CM | POA: Diagnosis present

## 2021-08-25 DIAGNOSIS — Z833 Family history of diabetes mellitus: Secondary | ICD-10-CM | POA: Diagnosis not present

## 2021-08-25 DIAGNOSIS — Z7951 Long term (current) use of inhaled steroids: Secondary | ICD-10-CM

## 2021-08-25 DIAGNOSIS — Z79899 Other long term (current) drug therapy: Secondary | ICD-10-CM

## 2021-08-25 DIAGNOSIS — F419 Anxiety disorder, unspecified: Secondary | ICD-10-CM | POA: Diagnosis present

## 2021-08-25 DIAGNOSIS — Z8249 Family history of ischemic heart disease and other diseases of the circulatory system: Secondary | ICD-10-CM

## 2021-08-25 DIAGNOSIS — Z888 Allergy status to other drugs, medicaments and biological substances status: Secondary | ICD-10-CM

## 2021-08-25 DIAGNOSIS — H269 Unspecified cataract: Secondary | ICD-10-CM | POA: Diagnosis present

## 2021-08-25 DIAGNOSIS — Z9010 Acquired absence of unspecified breast and nipple: Secondary | ICD-10-CM

## 2021-08-25 DIAGNOSIS — E669 Obesity, unspecified: Secondary | ICD-10-CM | POA: Diagnosis present

## 2021-08-25 DIAGNOSIS — Z885 Allergy status to narcotic agent status: Secondary | ICD-10-CM

## 2021-08-25 DIAGNOSIS — I509 Heart failure, unspecified: Secondary | ICD-10-CM | POA: Diagnosis present

## 2021-08-25 DIAGNOSIS — Z6838 Body mass index (BMI) 38.0-38.9, adult: Secondary | ICD-10-CM | POA: Diagnosis not present

## 2021-08-25 DIAGNOSIS — N183 Chronic kidney disease, stage 3 unspecified: Secondary | ICD-10-CM | POA: Diagnosis present

## 2021-08-25 DIAGNOSIS — K5732 Diverticulitis of large intestine without perforation or abscess without bleeding: Principal | ICD-10-CM | POA: Diagnosis present

## 2021-08-25 DIAGNOSIS — H5712 Ocular pain, left eye: Secondary | ICD-10-CM | POA: Diagnosis not present

## 2021-08-25 DIAGNOSIS — D259 Leiomyoma of uterus, unspecified: Secondary | ICD-10-CM | POA: Diagnosis present

## 2021-08-25 DIAGNOSIS — Z886 Allergy status to analgesic agent status: Secondary | ICD-10-CM

## 2021-08-25 DIAGNOSIS — H169 Unspecified keratitis: Secondary | ICD-10-CM | POA: Diagnosis present

## 2021-08-25 DIAGNOSIS — Z951 Presence of aortocoronary bypass graft: Secondary | ICD-10-CM

## 2021-08-25 DIAGNOSIS — Z87891 Personal history of nicotine dependence: Secondary | ICD-10-CM | POA: Diagnosis not present

## 2021-08-25 LAB — ABO/RH: ABO/RH(D): O NEG

## 2021-08-25 SURGERY — COLECTOMY, SIGMOID, ROBOT-ASSISTED
Anesthesia: General

## 2021-08-25 SURGERY — COLECTOMY, PARTIAL, ROBOT-ASSISTED, LAPAROSCOPIC
Anesthesia: General | Site: Abdomen

## 2021-08-25 MED ORDER — METOPROLOL SUCCINATE ER 50 MG PO TB24
50.0000 mg | ORAL_TABLET | Freq: Every morning | ORAL | Status: DC
  Administered 2021-08-26 – 2021-08-28 (×3): 50 mg via ORAL
  Filled 2021-08-25 (×2): qty 1
  Filled 2021-08-25: qty 2
  Filled 2021-08-25 (×2): qty 1

## 2021-08-25 MED ORDER — VASOPRESSIN 20 UNIT/ML IV SOLN
INTRAVENOUS | Status: AC
  Filled 2021-08-25: qty 1

## 2021-08-25 MED ORDER — ALBUMIN HUMAN 5 % IV SOLN
INTRAVENOUS | Status: AC
  Filled 2021-08-25: qty 250

## 2021-08-25 MED ORDER — MOMETASONE FURO-FORMOTEROL FUM 200-5 MCG/ACT IN AERO
2.0000 | INHALATION_SPRAY | Freq: Two times a day (BID) | RESPIRATORY_TRACT | Status: DC
  Administered 2021-08-25 – 2021-08-29 (×8): 2 via RESPIRATORY_TRACT
  Filled 2021-08-25: qty 8.8

## 2021-08-25 MED ORDER — ACETAMINOPHEN 325 MG PO TABS: 650.0000 mg | ORAL_TABLET | Freq: Four times a day (QID) | ORAL | Status: DC | PRN

## 2021-08-25 MED ORDER — SUGAMMADEX SODIUM 500 MG/5ML IV SOLN
INTRAVENOUS | Status: AC
  Filled 2021-08-25: qty 5

## 2021-08-25 MED ORDER — CHLORHEXIDINE GLUCONATE CLOTH 2 % EX PADS: 6.0000 | MEDICATED_PAD | Freq: Once | CUTANEOUS | Status: DC

## 2021-08-25 MED ORDER — SODIUM CHLORIDE 0.9 % IV SOLN
2.0000 g | INTRAVENOUS | Status: AC
  Administered 2021-08-25 (×2): 2 g via INTRAVENOUS

## 2021-08-25 MED ORDER — TRAZODONE HCL 50 MG PO TABS
150.0000 mg | ORAL_TABLET | Freq: Every day | ORAL | Status: DC
  Administered 2021-08-26 – 2021-08-28 (×3): 150 mg via ORAL
  Filled 2021-08-25 (×5): qty 1

## 2021-08-25 MED ORDER — LACTATED RINGERS IV SOLN: INTRAVENOUS | Status: DC

## 2021-08-25 MED ORDER — MIDAZOLAM HCL 2 MG/2ML IJ SOLN
INTRAMUSCULAR | Status: DC | PRN
  Administered 2021-08-25: 1 mg via INTRAVENOUS

## 2021-08-25 MED ORDER — BUPIVACAINE HCL (PF) 0.5 % IJ SOLN
INTRAMUSCULAR | Status: AC
  Filled 2021-08-25: qty 30

## 2021-08-25 MED ORDER — NAPHAZOLINE-GLYCERIN 0.012-0.25 % OP SOLN
1.0000 [drp] | Freq: Four times a day (QID) | OPHTHALMIC | Status: DC | PRN
Start: 2021-08-25 — End: 2021-08-26
  Administered 2021-08-25 – 2021-08-26 (×2): 2 [drp] via OPHTHALMIC
  Filled 2021-08-25 (×2): qty 15

## 2021-08-25 MED ORDER — PANTOPRAZOLE SODIUM 40 MG IV SOLR
40.0000 mg | Freq: Every day | INTRAVENOUS | Status: DC
  Administered 2021-08-25 – 2021-08-28 (×4): 40 mg via INTRAVENOUS
  Filled 2021-08-25 (×5): qty 40

## 2021-08-25 MED ORDER — ONDANSETRON HCL 4 MG/2ML IJ SOLN
INTRAMUSCULAR | Status: DC | PRN
  Administered 2021-08-25: 4 mg via INTRAVENOUS

## 2021-08-25 MED ORDER — CHLORHEXIDINE GLUCONATE 0.12 % MT SOLN
OROMUCOSAL | Status: AC
  Administered 2021-08-25: 06:00:00 15 mL via OROMUCOSAL
  Filled 2021-08-25: qty 15

## 2021-08-25 MED ORDER — SODIUM CHLORIDE 0.9 % IV SOLN
INTRAVENOUS | Status: DC | PRN
  Administered 2021-08-25: 09:00:00 70 mL

## 2021-08-25 MED ORDER — DEXAMETHASONE SODIUM PHOSPHATE 10 MG/ML IJ SOLN
INTRAMUSCULAR | Status: DC | PRN
  Administered 2021-08-25: 10 mg via INTRAVENOUS

## 2021-08-25 MED ORDER — PHENYLEPHRINE HCL (PRESSORS) 10 MG/ML IV SOLN
INTRAVENOUS | Status: DC | PRN
  Administered 2021-08-25 (×2): 160 ug via INTRAVENOUS

## 2021-08-25 MED ORDER — SUGAMMADEX SODIUM 500 MG/5ML IV SOLN
INTRAVENOUS | Status: DC | PRN
  Administered 2021-08-25: 250 mg via INTRAVENOUS

## 2021-08-25 MED ORDER — ALBUTEROL SULFATE HFA 108 (90 BASE) MCG/ACT IN AERS
1.0000 | INHALATION_SPRAY | Freq: Four times a day (QID) | RESPIRATORY_TRACT | Status: DC | PRN
Start: 2021-08-25 — End: 2021-08-25

## 2021-08-25 MED ORDER — EPHEDRINE SULFATE 50 MG/ML IJ SOLN
INTRAMUSCULAR | Status: DC | PRN
  Administered 2021-08-25: 5 mg via INTRAVENOUS
  Administered 2021-08-25: 10 mg via INTRAVENOUS
  Administered 2021-08-25 (×2): 5 mg via INTRAVENOUS
  Administered 2021-08-25: 10 mg via INTRAVENOUS
  Administered 2021-08-25: 5 mg via INTRAVENOUS
  Administered 2021-08-25: 10 mg via INTRAVENOUS

## 2021-08-25 MED ORDER — GLYCOPYRROLATE 0.2 MG/ML IJ SOLN
INTRAMUSCULAR | Status: DC | PRN
  Administered 2021-08-25: .2 mg via INTRAVENOUS

## 2021-08-25 MED ORDER — SODIUM CHLORIDE 0.9 % IV SOLN
INTRAVENOUS | Status: AC
  Filled 2021-08-25: qty 2

## 2021-08-25 MED ORDER — ORAL CARE MOUTH RINSE: 15.0000 mL | Freq: Once | OROMUCOSAL | Status: AC

## 2021-08-25 MED ORDER — FENTANYL CITRATE (PF) 100 MCG/2ML IJ SOLN
INTRAMUSCULAR | Status: DC | PRN
  Administered 2021-08-25: 25 ug via INTRAVENOUS
  Administered 2021-08-25: 50 ug via INTRAVENOUS
  Administered 2021-08-25: 25 ug via INTRAVENOUS
  Administered 2021-08-25: 75 ug via INTRAVENOUS
  Administered 2021-08-25: 50 ug via INTRAVENOUS

## 2021-08-25 MED ORDER — ALBUTEROL SULFATE (2.5 MG/3ML) 0.083% IN NEBU
2.5000 mg | INHALATION_SOLUTION | Freq: Four times a day (QID) | RESPIRATORY_TRACT | Status: DC | PRN
  Filled 2021-08-25: qty 3

## 2021-08-25 MED ORDER — ROCURONIUM BROMIDE 100 MG/10ML IV SOLN
INTRAVENOUS | Status: DC | PRN
  Administered 2021-08-25: 10 mg via INTRAVENOUS
  Administered 2021-08-25 (×2): 20 mg via INTRAVENOUS
  Administered 2021-08-25: 10 mg via INTRAVENOUS
  Administered 2021-08-25: 20 mg via INTRAVENOUS
  Administered 2021-08-25: 50 mg via INTRAVENOUS

## 2021-08-25 MED ORDER — FENTANYL CITRATE (PF) 100 MCG/2ML IJ SOLN
INTRAMUSCULAR | Status: AC
  Filled 2021-08-25: qty 2

## 2021-08-25 MED ORDER — ALBUMIN HUMAN 5 % IV SOLN: INTRAVENOUS | Status: DC | PRN

## 2021-08-25 MED ORDER — PROMETHAZINE HCL 25 MG/ML IJ SOLN: 6.2500 mg | Freq: Four times a day (QID) | INTRAMUSCULAR | Status: DC | PRN

## 2021-08-25 MED ORDER — ONDANSETRON HCL 4 MG/2ML IJ SOLN
INTRAMUSCULAR | Status: AC
  Filled 2021-08-25: qty 2

## 2021-08-25 MED ORDER — CELECOXIB 200 MG PO CAPS: 200.0000 mg | ORAL_CAPSULE | ORAL | Status: AC

## 2021-08-25 MED ORDER — HYDROMORPHONE HCL 1 MG/ML IJ SOLN
0.5000 mg | INTRAMUSCULAR | Status: DC | PRN
  Administered 2021-08-25 – 2021-08-27 (×10): 0.5 mg via INTRAVENOUS
  Filled 2021-08-25 (×10): qty 0.5

## 2021-08-25 MED ORDER — PROPOFOL 10 MG/ML IV BOLUS
INTRAVENOUS | Status: DC | PRN
  Administered 2021-08-25: 170 mg via INTRAVENOUS

## 2021-08-25 MED ORDER — HYDROMORPHONE HCL 1 MG/ML IJ SOLN
0.2500 mg | INTRAMUSCULAR | Status: DC | PRN
  Administered 2021-08-25: 17:00:00 0.5 mg via INTRAVENOUS
  Administered 2021-08-25: 17:00:00 0.25 mg via INTRAVENOUS

## 2021-08-25 MED ORDER — MIDAZOLAM HCL 2 MG/2ML IJ SOLN
INTRAMUSCULAR | Status: AC
  Filled 2021-08-25: qty 2

## 2021-08-25 MED ORDER — PHENYLEPHRINE HCL-NACL 20-0.9 MG/250ML-% IV SOLN
INTRAVENOUS | Status: AC
  Filled 2021-08-25: qty 250

## 2021-08-25 MED ORDER — HYDROMORPHONE HCL 1 MG/ML IJ SOLN
INTRAMUSCULAR | Status: AC
  Administered 2021-08-25: 17:00:00 0.25 mg via INTRAVENOUS
  Filled 2021-08-25: qty 1

## 2021-08-25 MED ORDER — SACUBITRIL-VALSARTAN 24-26 MG PO TABS
1.0000 | ORAL_TABLET | Freq: Two times a day (BID) | ORAL | Status: DC
  Administered 2021-08-25: 22:00:00 1 via ORAL
  Filled 2021-08-25 (×2): qty 1

## 2021-08-25 MED ORDER — LIDOCAINE HCL (CARDIAC) PF 100 MG/5ML IV SOSY
PREFILLED_SYRINGE | INTRAVENOUS | Status: DC | PRN
  Administered 2021-08-25: 100 mg via INTRAVENOUS

## 2021-08-25 MED ORDER — PROPOFOL 10 MG/ML IV BOLUS
INTRAVENOUS | Status: AC
  Filled 2021-08-25: qty 20

## 2021-08-25 MED ORDER — BUPIVACAINE LIPOSOME 1.3 % IJ SUSP
INTRAMUSCULAR | Status: AC
  Filled 2021-08-25: qty 20

## 2021-08-25 MED ORDER — ENOXAPARIN SODIUM 40 MG/0.4ML IJ SOSY: 40.0000 mg | PREFILLED_SYRINGE | INTRAMUSCULAR | Status: DC

## 2021-08-25 MED ORDER — VASOPRESSIN 20 UNIT/ML IV SOLN
INTRAVENOUS | Status: DC | PRN
  Administered 2021-08-25 (×4): 2 [IU] via INTRAVENOUS

## 2021-08-25 MED ORDER — POTASSIUM CHLORIDE CRYS ER 10 MEQ PO TBCR: 10.0000 meq | EXTENDED_RELEASE_TABLET | Freq: Every morning | ORAL | Status: DC

## 2021-08-25 MED ORDER — DEXAMETHASONE SODIUM PHOSPHATE 10 MG/ML IJ SOLN
INTRAMUSCULAR | Status: AC
  Filled 2021-08-25: qty 1

## 2021-08-25 MED ORDER — ACETAMINOPHEN 500 MG PO TABS: 1000.0000 mg | ORAL_TABLET | ORAL | Status: AC

## 2021-08-25 MED ORDER — INDOCYANINE GREEN 25 MG IV SOLR
INTRAVENOUS | Status: DC | PRN
  Administered 2021-08-25: 5 mg via INTRAVENOUS

## 2021-08-25 MED ORDER — PHENYLEPHRINE HCL-NACL 20-0.9 MG/250ML-% IV SOLN
INTRAVENOUS | Status: DC | PRN
  Administered 2021-08-25: 80 ug/min via INTRAVENOUS

## 2021-08-25 MED ORDER — PREGABALIN 75 MG PO CAPS
150.0000 mg | ORAL_CAPSULE | Freq: Three times a day (TID) | ORAL | Status: DC
  Administered 2021-08-25 – 2021-08-29 (×11): 150 mg via ORAL
  Filled 2021-08-25 (×11): qty 2

## 2021-08-25 MED ORDER — ACETAMINOPHEN 500 MG PO TABS
ORAL_TABLET | ORAL | Status: AC
  Administered 2021-08-25: 06:00:00 1000 mg via ORAL
  Filled 2021-08-25: qty 2

## 2021-08-25 MED ORDER — CHLORHEXIDINE GLUCONATE 0.12 % MT SOLN: 15.0000 mL | Freq: Once | OROMUCOSAL | Status: AC

## 2021-08-25 MED ORDER — CITALOPRAM HYDROBROMIDE 20 MG PO TABS
40.0000 mg | ORAL_TABLET | Freq: Every morning | ORAL | Status: DC
  Administered 2021-08-26 – 2021-08-29 (×4): 40 mg via ORAL
  Filled 2021-08-25 (×5): qty 2

## 2021-08-25 MED ORDER — CELECOXIB 200 MG PO CAPS
200.0000 mg | ORAL_CAPSULE | Freq: Two times a day (BID) | ORAL | Status: DC
  Administered 2021-08-25: 22:00:00 200 mg via ORAL
  Filled 2021-08-25 (×2): qty 1

## 2021-08-25 MED ORDER — ONDANSETRON HCL 4 MG/2ML IJ SOLN: 4.0000 mg | Freq: Four times a day (QID) | INTRAMUSCULAR | Status: DC | PRN

## 2021-08-25 MED ORDER — HYDROCODONE-ACETAMINOPHEN 7.5-325 MG PO TABS: 1.0000 | ORAL_TABLET | Freq: Once | ORAL | Status: DC | PRN

## 2021-08-25 MED ORDER — SODIUM CHLORIDE 0.9 % IV SOLN: INTRAVENOUS | Status: DC

## 2021-08-25 MED ORDER — EPHEDRINE 5 MG/ML INJ
INTRAVENOUS | Status: AC
  Filled 2021-08-25: qty 5

## 2021-08-25 MED ORDER — LIDOCAINE HCL (PF) 2 % IJ SOLN
INTRAMUSCULAR | Status: AC
  Filled 2021-08-25: qty 5

## 2021-08-25 MED ORDER — BUPIVACAINE HCL (PF) 0.5 % IJ SOLN
INTRAMUSCULAR | Status: DC | PRN
  Administered 2021-08-25: 30 mL

## 2021-08-25 MED ORDER — CELECOXIB 200 MG PO CAPS
ORAL_CAPSULE | ORAL | Status: AC
  Administered 2021-08-25: 06:00:00 200 mg via ORAL
  Filled 2021-08-25: qty 1

## 2021-08-25 MED ORDER — EZETIMIBE 10 MG PO TABS
10.0000 mg | ORAL_TABLET | Freq: Every morning | ORAL | Status: DC
  Administered 2021-08-26 – 2021-08-29 (×4): 10 mg via ORAL
  Filled 2021-08-25 (×4): qty 1

## 2021-08-25 MED ORDER — ONDANSETRON HCL 4 MG/2ML IJ SOLN
4.0000 mg | Freq: Once | INTRAMUSCULAR | Status: AC
  Administered 2021-08-25: 17:00:00 4 mg via INTRAVENOUS

## 2021-08-25 MED ORDER — TRAMADOL HCL 50 MG PO TABS
50.0000 mg | ORAL_TABLET | Freq: Four times a day (QID) | ORAL | Status: DC | PRN
  Administered 2021-08-26 – 2021-08-29 (×7): 50 mg via ORAL
  Filled 2021-08-25 (×7): qty 1

## 2021-08-25 MED ORDER — ONDANSETRON 4 MG PO TBDP: 4.0000 mg | ORAL_TABLET | Freq: Four times a day (QID) | ORAL | Status: DC | PRN

## 2021-08-25 MED ORDER — ROCURONIUM BROMIDE 10 MG/ML (PF) SYRINGE
PREFILLED_SYRINGE | INTRAVENOUS | Status: AC
  Filled 2021-08-25: qty 10

## 2021-08-25 MED ORDER — SODIUM CHLORIDE (PF) 0.9 % IJ SOLN
INTRAMUSCULAR | Status: AC
  Filled 2021-08-25: qty 50

## 2021-08-25 MED ORDER — ROSUVASTATIN CALCIUM 10 MG PO TABS
5.0000 mg | ORAL_TABLET | Freq: Every morning | ORAL | Status: DC
  Administered 2021-08-26 – 2021-08-29 (×4): 5 mg via ORAL
  Filled 2021-08-25 (×5): qty 1

## 2021-08-25 SURGICAL SUPPLY — 94 items
BLADE CLIPPER SURG (BLADE) IMPLANT
BLADE SURG SZ10 CARB STEEL (BLADE) IMPLANT
BLADE SURG SZ11 CARB STEEL (BLADE) ×2 IMPLANT
BULB RESERV EVAC DRAIN JP 100C (MISCELLANEOUS) ×2 IMPLANT
CANNULA REDUC XI 12-8 STAPL (CANNULA) ×1
CANNULA REDUCER 12-8 DVNC XI (CANNULA) ×1 IMPLANT
CHLORAPREP W/TINT 26 (MISCELLANEOUS) IMPLANT
COVER TIP SHEARS 8 DVNC (MISCELLANEOUS) ×1 IMPLANT
COVER TIP SHEARS 8MM DA VINCI (MISCELLANEOUS) ×1
DEFOGGER SCOPE WARMER CLEARIFY (MISCELLANEOUS) ×2 IMPLANT
DERMABOND ADVANCED (GAUZE/BANDAGES/DRESSINGS) ×1
DERMABOND ADVANCED .7 DNX12 (GAUZE/BANDAGES/DRESSINGS) ×1 IMPLANT
DRAIN CHANNEL JP 15F RND 16 (MISCELLANEOUS) ×2 IMPLANT
DRAPE ARM DVNC X/XI (DISPOSABLE) ×4 IMPLANT
DRAPE COLUMN DVNC XI (DISPOSABLE) ×1 IMPLANT
DRAPE DA VINCI XI ARM (DISPOSABLE) ×4
DRAPE DA VINCI XI COLUMN (DISPOSABLE) ×1
DRAPE LEGGINS SURG 28X43 STRL (DRAPES) ×2 IMPLANT
DRAPE UNDER BUTTOCK W/FLU (DRAPES) ×2 IMPLANT
DRSG OPSITE POSTOP 3X4 (GAUZE/BANDAGES/DRESSINGS) ×6 IMPLANT
DRSG OPSITE POSTOP 4X10 (GAUZE/BANDAGES/DRESSINGS) IMPLANT
DRSG OPSITE POSTOP 4X6 (GAUZE/BANDAGES/DRESSINGS) ×2 IMPLANT
DRSG OPSITE POSTOP 4X8 (GAUZE/BANDAGES/DRESSINGS) ×2 IMPLANT
ELECT CAUTERY BLADE 6.4 (BLADE) IMPLANT
ELECT REM PT RETURN 9FT ADLT (ELECTROSURGICAL) ×2
ELECTRODE REM PT RTRN 9FT ADLT (ELECTROSURGICAL) ×1 IMPLANT
GAUZE 4X4 16PLY ~~LOC~~+RFID DBL (SPONGE) ×2 IMPLANT
GLOVE SURG SYN 6.5 ES PF (GLOVE) ×12 IMPLANT
GLOVE SURG UNDER POLY LF SZ7 (GLOVE) ×12 IMPLANT
GOWN STRL REUS W/ TWL LRG LVL3 (GOWN DISPOSABLE) ×6 IMPLANT
GOWN STRL REUS W/TWL LRG LVL3 (GOWN DISPOSABLE) ×6
GRASPER SUT TROCAR 14GX15 (MISCELLANEOUS) IMPLANT
HANDLE YANKAUER SUCT BULB TIP (MISCELLANEOUS) ×2 IMPLANT
IRRIGATION STRYKERFLOW (MISCELLANEOUS) ×1 IMPLANT
IRRIGATOR STRYKERFLOW (MISCELLANEOUS) ×2
IRRIGATOR SUCT 8 DISP DVNC XI (IRRIGATION / IRRIGATOR) ×1 IMPLANT
IRRIGATOR SUCTION 8MM XI DISP (IRRIGATION / IRRIGATOR) ×1
IV NS 1000ML (IV SOLUTION) ×2
IV NS 1000ML BAXH (IV SOLUTION) ×2 IMPLANT
KIT PINK PAD W/HEAD ARE REST (MISCELLANEOUS) ×2
KIT PINK PAD W/HEAD ARM REST (MISCELLANEOUS) ×1 IMPLANT
LABEL OR SOLS (LABEL) IMPLANT
MANIFOLD NEPTUNE II (INSTRUMENTS) ×2 IMPLANT
NEEDLE HYPO 22GX1.5 SAFETY (NEEDLE) ×2 IMPLANT
OBTURATOR OPTICAL STANDARD 8MM (TROCAR) ×1
OBTURATOR OPTICAL STND 8 DVNC (TROCAR) ×1
OBTURATOR OPTICALSTD 8 DVNC (TROCAR) ×1 IMPLANT
PACK COLON CLEAN CLOSURE (MISCELLANEOUS) ×2 IMPLANT
PACK LAP CHOLECYSTECTOMY (MISCELLANEOUS) ×2 IMPLANT
PENCIL ELECTRO HAND CTR (MISCELLANEOUS) ×2 IMPLANT
PORT ACCESS TROCAR AIRSEAL 5 (TROCAR) ×2 IMPLANT
RELOAD STAPLER 3.5X60 BLU DVNC (STAPLE) ×2 IMPLANT
RELOAD STAPLER 4.3X60 GRN DVNC (STAPLE) ×2 IMPLANT
SEAL CANN UNIV 5-8 DVNC XI (MISCELLANEOUS) ×3 IMPLANT
SEAL XI 5MM-8MM UNIVERSAL (MISCELLANEOUS) ×3
SEALER VESSEL DA VINCI XI (MISCELLANEOUS) ×1
SEALER VESSEL EXT DVNC XI (MISCELLANEOUS) ×1 IMPLANT
SET TRI-LUMEN FLTR TB AIRSEAL (TUBING) ×2 IMPLANT
SOL PREP PVP 2OZ (MISCELLANEOUS) ×2
SOLUTION ELECTROLUBE (MISCELLANEOUS) ×2 IMPLANT
SOLUTION PREP PVP 2OZ (MISCELLANEOUS) ×1 IMPLANT
SPONGE T-LAP 18X18 ~~LOC~~+RFID (SPONGE) ×2 IMPLANT
SPONGE T-LAP 4X18 ~~LOC~~+RFID (SPONGE) ×2 IMPLANT
STAPLER 60 DA VINCI SURE FORM (STAPLE) ×1
STAPLER 60 SUREFORM DVNC (STAPLE) ×1 IMPLANT
STAPLER CANNULA SEAL DVNC XI (STAPLE) ×1 IMPLANT
STAPLER CANNULA SEAL XI (STAPLE) ×1
STAPLER CIRCULAR MANUAL XL 25 (STAPLE) IMPLANT
STAPLER CIRCULAR MANUAL XL 29 (STAPLE) ×2 IMPLANT
STAPLER CIRCULAR MANUAL XL 33 (STAPLE) IMPLANT
STAPLER RELOAD 3.5X60 BLU DVNC (STAPLE) ×2
STAPLER RELOAD 3.5X60 BLUE (STAPLE) ×2
STAPLER RELOAD 4.3X60 GREEN (STAPLE) ×2
STAPLER RELOAD 4.3X60 GRN DVNC (STAPLE) ×2
STAPLER SKIN PROX 35W (STAPLE) IMPLANT
SURGILUBE 2OZ TUBE FLIPTOP (MISCELLANEOUS) ×2 IMPLANT
SUT DVC VLOC 90 3-0 CV23 VLT (SUTURE) ×2
SUT ETHILON 3-0 FS-10 30 BLK (SUTURE) ×2
SUT MNCRL AB 4-0 PS2 18 (SUTURE) ×2 IMPLANT
SUT PDS AB 1 CT1 36 (SUTURE) ×8 IMPLANT
SUT SILK 3 0 SH 30 (SUTURE) IMPLANT
SUT VIC AB 3-0 SH 27 (SUTURE) ×2
SUT VIC AB 3-0 SH 27X BRD (SUTURE) ×2 IMPLANT
SUTURE DVC VLC 90 3-0 CV23 VLT (SUTURE) ×1 IMPLANT
SUTURE EHLN 3-0 FS-10 30 BLK (SUTURE) ×1 IMPLANT
SYR 30ML LL (SYRINGE) ×4 IMPLANT
SYS LAPSCP GELPORT 120MM (MISCELLANEOUS) ×4
SYS TROCAR 1.5-3 SLV ABD GEL (ENDOMECHANICALS) ×2
SYSTEM LAPSCP GELPORT 120MM (MISCELLANEOUS) ×2 IMPLANT
SYSTEM TROCR 1.5-3 SLV ABD GEL (ENDOMECHANICALS) ×1 IMPLANT
SYSTEM WECK SHIELD CLOSURE (TROCAR) IMPLANT
TRAY FOLEY MTR SLVR 16FR STAT (SET/KITS/TRAYS/PACK) ×2 IMPLANT
TROCAR XCEL NON-BLD 5MMX100MML (ENDOMECHANICALS) IMPLANT
WATER STERILE IRR 500ML POUR (IV SOLUTION) IMPLANT

## 2021-08-25 NOTE — Interval H&P Note (Signed)
No change. Ok to proceed

## 2021-08-25 NOTE — Anesthesia Preprocedure Evaluation (Signed)
Anesthesia Evaluation  Patient identified by MRN, date of birth, ID band Patient awake    Reviewed: Allergy & Precautions, NPO status , Patient's Chart, lab work & pertinent test results  Airway Mallampati: III  TM Distance: >3 FB Neck ROM: full    Dental   Pulmonary neg pulmonary ROS, former smoker,    Pulmonary exam normal        Cardiovascular negative cardio ROS Normal cardiovascular exam     Neuro/Psych negative neurological ROS  negative psych ROS   GI/Hepatic negative GI ROS, Neg liver ROS,   Endo/Other  negative endocrine ROS  Renal/GU      Musculoskeletal   Abdominal (+) + obese,   Peds  Hematology negative hematology ROS (+)   Anesthesia Other Findings Past Medical History: No date: Anxiety No date: Bilateral anterior knee pain 2000: Breast cancer ([place])     Comment:  left lumpectomy No date: CHF (congestive heart failure) (HCC) No date: CHF (congestive heart failure) (HCC) No date: Chronic insomnia No date: Depression No date: Diverticulitis No date: Dysphagia No date: Dyspnea No date: Fibromyalgia No date: GERD (gastroesophageal reflux disease) No date: Hyperlipidemia No date: Hypothyroidism No date: Obesity, Class III, BMI 40-49.9 (morbid obesity) (HCC) No date: Pericardial effusion 2000: Personal history of chemotherapy     Comment:  left breast cancer 2000: Personal history of radiation therapy     Comment:  left breast cancer No date: Precordial pain No date: SOB (shortness of breath) No date: Urinary incontinence in female  Past Surgical History: 2000: BREAST BIOPSY; Left     Comment:  Positive 2000: BREAST LUMPECTOMY; Left 2000: BREAST LUMPECTOMY WITH AXILLARY LYMPH NODE DISSECTION; Left No date: BREAST SURGERY No date: CHOLECYSTECTOMY 08/12/2021: COLONOSCOPY WITH PROPOFOL; N/A     Comment:  Procedure: COLONOSCOPY WITH PROPOFOL;  Surgeon:               [name], MD;   Location: [facility] ENDOSCOPY;                Service: Endoscopy;  Laterality: N/A; No date: CORONARY ARTERY BYPASS GRAFT 01/08/2017: ESOPHAGOGASTRODUODENOSCOPY (EGD) WITH PROPOFOL; N/A     Comment:  Procedure: ESOPHAGOGASTRODUODENOSCOPY (EGD) WITH               PROPOFOL;  Surgeon: Manya Silvas, MD;  Location: [facility]              ENDOSCOPY;  Service: Endoscopy;  Laterality: N/A; No date: NECK SURGERY     Comment:  discs No date: TUBAL LIGATION  BMI    Body Mass Index: 37.59 kg/m      Reproductive/Obstetrics negative OB ROS                             Anesthesia Physical Anesthesia Plan  ASA: 3  Anesthesia Plan: General ETT   Post-op Pain Management:    Induction: Intravenous  PONV Risk Score and Plan: Ondansetron, Dexamethasone, Midazolam and Treatment may vary due to age or medical condition  Airway Management Planned: Oral ETT  Additional Equipment:   Intra-op Plan:   Post-operative Plan: Extubation in OR  Informed Consent: I have reviewed the patients History and Physical, chart, labs and discussed the procedure including the risks, benefits and alternatives for the proposed anesthesia with the patient or authorized representative who has indicated his/her understanding and acceptance.     Dental Advisory Given  Plan Discussed with: Anesthesiologist, CRNA and Surgeon  Anesthesia Plan Comments: (Patient consented for risks of anesthesia including but not limited to:  - adverse reactions to medications - damage to eyes, teeth, lips or other oral mucosa - nerve damage due to positioning  - sore throat or hoarseness - Damage to heart, brain, nerves, lungs, other parts of body or loss of life  Patient voiced understanding.)        Anesthesia Quick Evaluation

## 2021-08-25 NOTE — Anesthesia Procedure Notes (Signed)
Procedure Name: Intubation Date/Time: 08/25/2021 7:49 AM Performed by: Johnna Acosta, CRNA Pre-anesthesia Checklist: Patient identified, Emergency Drugs available, Suction available, Patient being monitored and Timeout performed Patient Re-evaluated:Patient Re-evaluated prior to induction Oxygen Delivery Method: Circle system utilized Preoxygenation: Pre-oxygenation with 100% oxygen Induction Type: IV induction and Cricoid Pressure applied Ventilation: Mask ventilation without difficulty Laryngoscope Size: McGraph and 3 Grade View: Grade II Tube type: Oral Tube size: 7.5 mm Number of attempts: 1 Airway Equipment and Method: Stylet and Video-laryngoscopy Placement Confirmation: ETT inserted through vocal cords under direct vision, positive ETCO2 and breath sounds checked- equal and bilateral Secured at: 22 cm Tube secured with: Tape Dental Injury: Teeth and Oropharynx as per pre-operative assessment

## 2021-08-25 NOTE — Transfer of Care (Signed)
Immediate Anesthesia Transfer of Care Note  Patient: Ann Parker  Procedure(s) Performed: XI ROBOT ASSISTED SIGMOID COLECTOMY  Patient Location: PACU  Anesthesia Type:General  Level of Consciousness: sedated  Airway & Oxygen Therapy: Patient Spontanous Breathing and Patient connected to face mask oxygen  Post-op Assessment: Report given to RN and Post -op Vital signs reviewed and stable  Post vital signs: Reviewed and stable  Last Vitals:  Vitals Value Taken Time  BP 116/72 08/25/21 1530  Temp 35.7 C 08/25/21 1525  Pulse 60 08/25/21 1531  Resp 14 08/25/21 1531  SpO2 100 % 08/25/21 1531  Vitals shown include unvalidated device data.  Last Pain:  Vitals:   08/25/21 1525  PainSc: Asleep         Complications: No notable events documented.

## 2021-08-26 LAB — CBC
HCT: 34.3 % — ABNORMAL LOW (ref 36.0–46.0)
Hemoglobin: 11.2 g/dL — ABNORMAL LOW (ref 12.0–15.0)
MCH: 27.3 pg (ref 26.0–34.0)
MCHC: 32.7 g/dL (ref 30.0–36.0)
MCV: 83.5 fL (ref 80.0–100.0)
Platelets: 119 10*3/uL — ABNORMAL LOW (ref 150–400)
RBC: 4.11 MIL/uL (ref 3.87–5.11)
RDW: 16.1 % — ABNORMAL HIGH (ref 11.5–15.5)
WBC: 10.1 10*3/uL (ref 4.0–10.5)
nRBC: 0 % (ref 0.0–0.2)

## 2021-08-26 LAB — BASIC METABOLIC PANEL
Anion gap: 7 (ref 5–15)
BUN: 23 mg/dL (ref 8–23)
CO2: 21 mmol/L — ABNORMAL LOW (ref 22–32)
Calcium: 8.2 mg/dL — ABNORMAL LOW (ref 8.9–10.3)
Chloride: 108 mmol/L (ref 98–111)
Creatinine, Ser: 2.1 mg/dL — ABNORMAL HIGH (ref 0.44–1.00)
GFR, Estimated: 25 mL/min — ABNORMAL LOW (ref >60)
Glucose, Bld: 118 mg/dL — ABNORMAL HIGH (ref 70–99)
Potassium: 5 mmol/L (ref 3.5–5.1)
Sodium: 136 mmol/L (ref 135–145)

## 2021-08-26 MED ORDER — ENOXAPARIN SODIUM 30 MG/0.3ML IJ SOSY
30.0000 mg | PREFILLED_SYRINGE | INTRAMUSCULAR | Status: DC
  Administered 2021-08-26 – 2021-08-27 (×2): 30 mg via SUBCUTANEOUS
  Filled 2021-08-26 (×2): qty 0.3

## 2021-08-26 MED ORDER — ERYTHROMYCIN 5 MG/GM OP OINT
TOPICAL_OINTMENT | Freq: Four times a day (QID) | OPHTHALMIC | Status: DC
Start: 2021-08-26 — End: 2021-08-27
  Administered 2021-08-26 – 2021-08-27 (×4): 1 via OPHTHALMIC
  Filled 2021-08-26: qty 1

## 2021-08-26 MED ORDER — CHLORHEXIDINE GLUCONATE CLOTH 2 % EX PADS
6.0000 | MEDICATED_PAD | Freq: Every day | CUTANEOUS | Status: DC
  Administered 2021-08-26 – 2021-08-29 (×3): 6 via TOPICAL

## 2021-08-26 MED ORDER — CHLORHEXIDINE GLUCONATE CLOTH 2 % EX PADS: 6.0000 | MEDICATED_PAD | Freq: Every day | CUTANEOUS | Status: DC

## 2021-08-26 MED ORDER — ACETAMINOPHEN 325 MG PO TABS
650.0000 mg | ORAL_TABLET | Freq: Four times a day (QID) | ORAL | Status: DC
  Administered 2021-08-26 – 2021-08-29 (×12): 650 mg via ORAL
  Filled 2021-08-26 (×12): qty 2

## 2021-08-26 NOTE — Evaluation (Signed)
Patient evaluated for complaints of eye pain. Patient reports a "scratchy" eye, blurred vision, and eye discomfort.  Redness noted to upper lid and below the eye.  Dr. Lysle Pearl at the bedside to evaluate the patient's eye discomfort as well. Dr. Lysle Pearl will plan to consult ophthalmology to evaluate the patient's complaints along with prescribing eye ointment.  I discussed Dr. Ines Bloomer plan with Dr. Bertell Maria.  No further orders.

## 2021-08-26 NOTE — Progress Notes (Signed)
Subjective:  CC: Ann Parker is a 70 y.o. female  Hospital stay day 1, 1 Day Post-Op robotic hand assisted  lap sigmoidectomy  HPI: Complains of left thigh pain since surgery.  States she has a scratching feeling towards the upper part of her eye.  Also complaining of decreasing visual acuity.  Eyedrop and saline irrigation overnight has not improved.  Still denies flatus or BM.  Abdominal pain mostly is in the suprapubic region and left side.  ROS:  General: Denies weight loss, weight gain, fatigue, fevers, chills, and night sweats. Heart: Denies chest pain, palpitations, racing heart, irregular heartbeat, leg pain or swelling, and decreased activity tolerance. Respiratory: Denies breathing difficulty, shortness of breath, wheezing, cough, and sputum. GI: Denies change in appetite, heartburn, nausea, vomiting, constipation, diarrhea, and blood in stool. GU: Denies difficulty urinating, pain with urinating, urgency, frequency, blood in urine.   Objective:   Temp:  [96.3 F (35.7 C)-99.1 F (37.3 C)] 99.1 F (37.3 C) (11/18 0605) Pulse Rate:  [60-75] 74 (11/18 0605) Resp:  [10-18] 14 (11/18 0605) BP: (100-133)/(56-82) 100/56 (11/18 0605) SpO2:  [90 %-100 %] 93 % (11/18 0605)     Height: 5\' 7"  (170.2 cm) Weight: 108.9 kg BMI (Calculated): 37.58   Intake/Output this shift:   Intake/Output Summary (Last 24 hours) at 08/26/2021 0840 Last data filed at 08/26/2021 0656 Gross per 24 hour  Intake 2150 ml  Output 695 ml  Net 1455 ml    Constitutional :  alert, cooperative, appears stated age, and mild distress  Respiratory:  clear to auscultation bilaterally  Cardiovascular:  regular rate and rhythm  Gastrointestinal: Soft, no guarding, tenderness to palpation along the left lower quadrant and suprapubic region as expected.  Minimal tenderness in the remaining quadrants and no complaint of diffuse Peritonitis .  JP with serosanguineous discharge slightly more sanguinous.  Skin:  Cool and moist.  Dressings intact.  Psychiatric: Normal affect, non-agitated, not confused  Eye: Left eye with increasing injected sclera no obvious foreign body, no hyphema or purulent discharge.  Surrounding orbit with some redness as well likely from rubbing the eye overnight.  Reports some consistent visual acuity changes, unsure if from excess watering or actual drop in acuity.    LABS:  CMP Latest Ref Rng & Units 08/26/2021 08/25/2021 08/16/2021  Glucose 70 - 99 mg/dL 118(H) - 80  BUN 8 - 23 mg/dL 23 - 19  Creatinine 0.44 - 1.00 mg/dL 2.10(H) 1.58(H) 1.13(H)  Sodium 135 - 145 mmol/L 136 - 139  Potassium 3.5 - 5.1 mmol/L 5.0 - 4.3  Chloride 98 - 111 mmol/L 108 - 104  CO2 22 - 32 mmol/L 21(L) - 27  Calcium 8.9 - 10.3 mg/dL 8.2(L) - 9.2  Total Protein 6.5 - 8.1 g/dL - - -  Total Bilirubin 0.3 - 1.2 mg/dL - - -  Alkaline Phos 38 - 126 U/L - - -  AST 15 - 41 U/L - - -  ALT 0 - 44 U/L - - -   CBC Latest Ref Rng & Units 08/26/2021 08/25/2021 08/16/2021  WBC 4.0 - 10.5 K/uL 10.1 11.7(H) 7.9  Hemoglobin 12.0 - 15.0 g/dL 11.2(L) 12.0 12.5  Hematocrit 36.0 - 46.0 % 34.3(L) 36.4 37.6  Platelets 150 - 400 K/uL 119(L) 121(L) 138(L)    RADS: N/a Assessment:   S/p robotic hand assisted  lap sigmoidectomy  Corneal abrasion likely for eye issues.  Ointment ordered for her left eye.  will also be calling ophtho due to  report of worsening pain and continued decrease in visual acuity.  Anesthesia service notified as well and they are aware.  No further recommendations at this point  Increasing creatinine - I increased her fluid rate to 171ml/hr.  will be continuing foley for strict I&O.  Hold Entresto and decrease Lovenox dose for today.  We will also hold oral potassium supplements due to K being at 5.   She needs to be in a chair today, maybe up and ambulating later if she can tolerate.  gum and hard candy ok to give along with ice chips.  Continue n.p.o. otherwise until return of bowel  function.  Leukocytosis decreased which is reassuring.  Hemoglobin drop noted but no evidence of active bleeding we will continue to monitor with JP drain and serial labs

## 2021-08-26 NOTE — Op Note (Addendum)
Preoperative diagnosis: Sigmoid diverticulitis Postoperative diagnosis: Same  Procedure: Robotic and assisted laparoscopic sigmoidectomy.   Anesthesia: GETA   Surgeon: Benjamine Sprague Assistant: Cintron for exposure and bedside assist    Wound Classification: clean contaminated   Specimen: Sigmoid colon   Complications: None   Estimated Blood Loss: 100 mL  Indications: Patient  presented with above.  Please see H&P for further details.     FIndings: 1.  Sigmoid colon wall thickening consistent with previous history of diverticulitis 2.  Successful EEA anastomosis with no air leak  3.  Normal anatomy 4.  Adequate hemostasis.    Description of procedure: The patient was placed on the operating table in the low lithotomy position, both arms tucked. General anesthesia was induced.  Foley placed. A time-out was completed verifying correct patient, procedure, site, positioning, and implant(s) and/or special equipment prior to beginning this procedure. The abdomen was prepped and draped in the usual sterile fashion.    Right lower quadrant incision made and dissection carried down into the abdominal cavity.  Laparoscopic GelPort then placed through this incision and insufflation up to 15 mmHg started.  12 mm port was placed through the GelPort and through this the camera was inserted in no injury to the surrounding organs were noted.  Under direct visualization 3 additional 8 millimeter trocars were placed along the right side of the patient and an additional air seal assistant port was placed between the ports.  Tap block performed with Exparel.  The table was placed in the Trendelenburg, right side down position. Xi robotic platform was then brought to the operative field and docked.     Inspection of the sigmoid colon, noted thickened colon wall along with a very thick mesentery secondary to patient body habitus.  Lateral attachments were taken down using sharp dissection.  Area proximal to the  thickened colon wall was chosen for anvil insertion site.  29 mm EEA stapler anvil was placed through the GelPort and grasped with force bipolar at its base.  A longitudinal incision was made in the colon with scissors distal to the previously marked site to accommodate the anvil.  The tip of the anvil was then placed at the point of the previous marked site and a small hole was made to accommodate the anvil tip through the colon wall.  3-0 V lock was then used to close the initial hole made to insert the anvil within the colon and then a 60 mm green load stapler was used to transect immediately proximal to the closed hole.  The mesentery was then transected using vessel sealer down to the rectum.  Another 60 mm green load stapler was then used to transect the colon at this point   ICG was infused and adequate circulation was noted at both staple lines.  Proximal end did not reach the rectal stump. at this point additional dissection had to be carried around the splenic flexure on the medial and lateral aspect to gain additional length.  In addition, extensive removal of the very thick and heavy omentum was required to obtain further length to reach the rectal stump.  In the end, extension of the mesentery dissection had to be performed up and around the splenic flexure. Midline incision was also created to accommodate a hand port in order to directly palpate the amount of tension on the colon as well as to release some additional adhesions to finally allow the proximal end to reach the rectal stump with minimal tension.  blood  flow to the proximal and then was again noted to be adequate prior to the anastamosis.   EEA stapler placed through the rectal stump and guided to the distal staple line and a side-to-side anastomosis was created, after confirming no twisting of the proximal mesentery and no tension noted on the staple line.  Saline was infused into the pelvis, and a air leak test was performed and  anastomosis did not show any leaks.  2 rings created during anastomosis was also intact. all saline was then suctioned out, anastomosis looked viable, and hemostasis was noted throughout the abdomen.  36 French round drain placed through right lower quadrant port and placed around anastomosis, secured to skin using 3-0 nylon.   Clean closure protocol initiated.  Lower quadrant gelport and midline hand port site  closed with 1 PDS x2.  3-0 Vicryl used to approximate the subcutaneous tissues prior to closing all skin sites with staples.  All wounds then dressed with honeycomb dressing.  Drain site dressed with drain sponge and paper tape.    The patient tolerated the procedure well, awakened from anesthesia and was taken to the postanesthesia care unit in satisfactory condition with Foley in place.  Sponge count correct at end of procedure.

## 2021-08-26 NOTE — Anesthesia Postprocedure Evaluation (Signed)
Anesthesia Post Note  Patient: Ann Parker  Procedure(s) Performed: XI ROBOT ASSISTED SIGMOID COLECTOMY  Patient location during evaluation: PACU Anesthesia Type: General Level of consciousness: awake and alert Pain management: pain level controlled Vital Signs Assessment: post-procedure vital signs reviewed and stable Respiratory status: spontaneous breathing, nonlabored ventilation, respiratory function stable and patient connected to nasal cannula oxygen Cardiovascular status: blood pressure returned to baseline and stable Postop Assessment: no apparent nausea or vomiting Anesthetic complications: no   No notable events documented.   Last Vitals:  Vitals:   08/25/21 2000 08/26/21 0605  BP: 105/67 (!) 100/56  Pulse: 70 74  Resp: 18 14  Temp:  37.3 C  SpO2: 95% 93%    Last Pain:  Vitals:   08/26/21 0612  TempSrc:   PainSc: [place]

## 2021-08-27 DIAGNOSIS — S0500XA Injury of conjunctiva and corneal abrasion without foreign body, unspecified eye, initial encounter: Secondary | ICD-10-CM | POA: Diagnosis not present

## 2021-08-27 LAB — BPAM RBC
Blood Product Expiration Date: 202211282359
Blood Product Expiration Date: 202211282359
Unit Type and Rh: 9500
Unit Type and Rh: 9500

## 2021-08-27 LAB — BASIC METABOLIC PANEL
Anion gap: 3 — ABNORMAL LOW (ref 5–15)
BUN: 25 mg/dL — ABNORMAL HIGH (ref 8–23)
CO2: 24 mmol/L (ref 22–32)
Calcium: 8.2 mg/dL — ABNORMAL LOW (ref 8.9–10.3)
Chloride: 113 mmol/L — ABNORMAL HIGH (ref 98–111)
Creatinine, Ser: 1.35 mg/dL — ABNORMAL HIGH (ref 0.44–1.00)
GFR, Estimated: 42 mL/min — ABNORMAL LOW (ref >60)
Glucose, Bld: 89 mg/dL (ref 70–99)
Potassium: 4.2 mmol/L (ref 3.5–5.1)
Sodium: 140 mmol/L (ref 135–145)

## 2021-08-27 LAB — TYPE AND SCREEN
ABO/RH(D): O NEG
Antibody Screen: POSITIVE
Unit division: 0
Unit division: 0

## 2021-08-27 LAB — CBC
HCT: 29.2 % — ABNORMAL LOW (ref 36.0–46.0)
Hemoglobin: 9.7 g/dL — ABNORMAL LOW (ref 12.0–15.0)
MCH: 28.1 pg (ref 26.0–34.0)
MCHC: 33.2 g/dL (ref 30.0–36.0)
MCV: 84.6 fL (ref 80.0–100.0)
Platelets: 91 10*3/uL — ABNORMAL LOW (ref 150–400)
RBC: 3.45 MIL/uL — ABNORMAL LOW (ref 3.87–5.11)
RDW: 16.5 % — ABNORMAL HIGH (ref 11.5–15.5)
WBC: 6.7 10*3/uL (ref 4.0–10.5)
nRBC: 0 % (ref 0.0–0.2)

## 2021-08-27 MED ORDER — ERYTHROMYCIN 5 MG/GM OP OINT
TOPICAL_OINTMENT | Freq: Three times a day (TID) | OPHTHALMIC | Status: DC
  Administered 2021-08-27 – 2021-08-29 (×6): 1 via OPHTHALMIC
  Filled 2021-08-27 (×2): qty 1

## 2021-08-27 NOTE — Consult Note (Signed)
Subjective: 70 yo POD 2 s/p partial colectomy. Pt reports blurry vision OS. Mild discomfort, improving since onset. " Ointment helps" Pt reads without specs. Pt states that she had a routine eye exam in Sept. Was told about cataracts.  Objective: Vital signs in last 24 hours: Temp:  [97.6 F (36.4 C)-98.7 F (37.1 C)] 97.9 F (36.6 C) (11/19 0450) Pulse Rate:  [64-65] 65 (11/19 0450) Resp:  [16-20] 18 (11/19 0450) BP: (97-104)/(50-55) 97/53 (11/19 0450) SpO2:  [94 %-99 %] 94 % (11/19 0450)    Objective: UCVA near  J1 OD  J7 OS  Ant Seg :  OD grossly normal. With moderate cataract.                  OS small well healing abrasion and mild superficial keratitis.   With moderate cataract.   Recent Labs    08/26/21 0522 08/27/21 0510  WBC 10.1 6.7  HGB 11.2* 9.7*  HCT 34.3* 29.2*  NA 136 140  K 5.0 4.2  CL 108 113*  CO2 21* 24  BUN 23 25*  CREATININE 2.10* 1.35*    Studies/Results: No results found.  Medications: I have reviewed the patient's current medications.  Assessment/Plan: OS  with well healing abrasion. Continue ophthalmic ung TID for 4 more days. Follow up at [facility] if any residual symptoms. I gave the patient my card.     LOS: 2 days   Birder Robson 11/19/20229:32 AM

## 2021-08-27 NOTE — Progress Notes (Addendum)
Subjective:  CC: Ann Parker is a 70 y.o. female  Hospital stay day 2, 2 Days Post-Op robotic hand assisted  lap sigmoidectomy  HPI: Eye pain improved.    Still denies flatus or BM.  Abdominal pain mostly is in the suprapubic region and left side, but improving.  ROS:  General: Denies weight loss, weight gain, fatigue, fevers, chills, and night sweats. Heart: Denies chest pain, palpitations, racing heart, irregular heartbeat, leg pain or swelling, and decreased activity tolerance. Respiratory: Denies breathing difficulty, shortness of breath, wheezing, cough, and sputum. GI: Denies change in appetite, heartburn, nausea, vomiting, constipation, diarrhea, and blood in stool. GU: Denies difficulty urinating, pain with urinating, urgency, frequency, blood in urine.   Objective:   Temp:  [97.6 F (36.4 C)-98.5 F (36.9 C)] 97.9 F (36.6 C) (11/19 0450) Pulse Rate:  [64-65] 65 (11/19 0450) Resp:  [16-20] 18 (11/19 0450) BP: (97-104)/(50-55) 97/53 (11/19 0450) SpO2:  [94 %-99 %] 94 % (11/19 0450)     Height: 5\' 7"  (170.2 cm) Weight: 108.9 kg BMI (Calculated): 37.58   Intake/Output this shift:   Intake/Output Summary (Last 24 hours) at 08/27/2021 1037 Last data filed at 08/27/2021 0322 Gross per 24 hour  Intake 2832.4 ml  Output 430 ml  Net 2402.4 ml    Constitutional :  alert, cooperative, appears stated age, and mild distress  Respiratory:  clear to auscultation bilaterally  Cardiovascular:  regular rate and rhythm  Gastrointestinal: Soft, no guarding, tenderness to palpation along the left lower quadrant and suprapubic region as expected.  Minimal tenderness in the remaining quadrants and no complaint of diffuse Peritonitis .  JP with serosanguineous discharge, still  slightly more sanguinous.  Skin: Cool and moist.  Dressings intact.  Psychiatric: Normal affect, non-agitated, not confused  Eye: Resolved.    LABS:  CMP Latest Ref Rng & Units 08/27/2021 08/26/2021  08/16/2021  Glucose 70 - 99 mg/dL 89 118(H) 80  BUN 8 - 23 mg/dL 25(H) 23 19  Creatinine 0.44 - 1.00 mg/dL 1.35(H) 2.10(H) 1.13(H)  Sodium 135 - 145 mmol/L 140 136 139  Potassium 3.5 - 5.1 mmol/L 4.2 5.0 4.3  Chloride 98 - 111 mmol/L 113(H) 108 104  CO2 22 - 32 mmol/L 24 21(L) 27  Calcium 8.9 - 10.3 mg/dL 8.2(L) 8.2(L) 9.2  Total Protein 6.5 - 8.1 g/dL - - -  Total Bilirubin 0.3 - 1.2 mg/dL - - -  Alkaline Phos 38 - 126 U/L - - -  AST 15 - 41 U/L - - -  ALT 0 - 44 U/L - - -   CBC Latest Ref Rng & Units 08/27/2021 08/26/2021 08/16/2021  WBC 4.0 - 10.5 K/uL 6.7 10.1 7.9  Hemoglobin 12.0 - 15.0 g/dL 9.7(L) 11.2(L) 12.5  Hematocrit 36.0 - 46.0 % 29.2(L) 34.3(L) 37.6  Platelets 150 - 400 K/uL 91(L) 119(L) 138(L)    RADS: N/a Assessment:   S/p robotic hand assisted  lap sigmoidectomy  Corneal abrasion appreciate consult.  Continue ointment as recommended.  Increasing creatinine - improving.  Will restart entresto and K supplement as renal function normalizes.  Continue current IVF rate.  Continue OOB to chair.  Ambulate hall.  gum and hard candy ok to give along with ice chips.  Continue n.p.o. otherwise until return of bowel function.   Hemoglobin drop continues but likely dilutional. continue to monitor with JP drain and serial labs.  Hold lovenox just in case.

## 2021-08-28 LAB — CBC WITH DIFFERENTIAL/PLATELET
Abs Immature Granulocytes: 0.03 10*3/uL (ref 0.00–0.07)
Basophils Absolute: 0 10*3/uL (ref 0.0–0.1)
Basophils Relative: 0 %
Eosinophils Absolute: 0.2 10*3/uL (ref 0.0–0.5)
Eosinophils Relative: 4 %
HCT: 31.1 % — ABNORMAL LOW (ref 36.0–46.0)
Hemoglobin: 10.2 g/dL — ABNORMAL LOW (ref 12.0–15.0)
Immature Granulocytes: 1 %
Lymphocytes Relative: 18 %
Lymphs Abs: 1.1 10*3/uL (ref 0.7–4.0)
MCH: 27.6 pg (ref 26.0–34.0)
MCHC: 32.8 g/dL (ref 30.0–36.0)
MCV: 84.1 fL (ref 80.0–100.0)
Monocytes Absolute: 0.4 10*3/uL (ref 0.1–1.0)
Monocytes Relative: 7 %
Neutro Abs: 4.3 10*3/uL (ref 1.7–7.7)
Neutrophils Relative %: 70 %
Platelets: 93 10*3/uL — ABNORMAL LOW (ref 150–400)
RBC: 3.7 MIL/uL — ABNORMAL LOW (ref 3.87–5.11)
RDW: 15.9 % — ABNORMAL HIGH (ref 11.5–15.5)
WBC: 6.1 10*3/uL (ref 4.0–10.5)
nRBC: 0 % (ref 0.0–0.2)

## 2021-08-28 LAB — BASIC METABOLIC PANEL
Anion gap: 6 (ref 5–15)
BUN: 20 mg/dL (ref 8–23)
CO2: 22 mmol/L (ref 22–32)
Calcium: 8.3 mg/dL — ABNORMAL LOW (ref 8.9–10.3)
Chloride: 112 mmol/L — ABNORMAL HIGH (ref 98–111)
Creatinine, Ser: 1.02 mg/dL — ABNORMAL HIGH (ref 0.44–1.00)
GFR, Estimated: 58 mL/min — ABNORMAL LOW (ref >60)
Glucose, Bld: 95 mg/dL (ref 70–99)
Potassium: 3.9 mmol/L (ref 3.5–5.1)
Sodium: 140 mmol/L (ref 135–145)

## 2021-08-28 MED ORDER — ENOXAPARIN SODIUM 40 MG/0.4ML IJ SOSY
40.0000 mg | PREFILLED_SYRINGE | INTRAMUSCULAR | Status: DC
  Administered 2021-08-28: 40 mg via SUBCUTANEOUS
  Filled 2021-08-28: qty 0.4

## 2021-08-28 NOTE — Progress Notes (Signed)
Subjective:  CC: Ann Parker is a 70 y.o. female  Hospital stay day 3, 3 Days Post-Op robotic hand assisted  lap sigmoidectomy  HPI: Multiple bowel movements reported overnight.  Pain continues to improve.  Ambulating.  Tolerated clears yesterday.  ROS:  General: Denies weight loss, weight gain, fatigue, fevers, chills, and night sweats. Heart: Denies chest pain, palpitations, racing heart, irregular heartbeat, leg pain or swelling, and decreased activity tolerance. Respiratory: Denies breathing difficulty, shortness of breath, wheezing, cough, and sputum. GI: Denies change in appetite, heartburn, nausea, vomiting, constipation, diarrhea, and blood in stool. GU: Denies difficulty urinating, pain with urinating, urgency, frequency, blood in urine.   Objective:   Temp:  [97.3 F (36.3 C)-98 F (36.7 C)] 97.7 F (36.5 C) (11/20 0801) Pulse Rate:  [62-66] 64 (11/20 0801) Resp:  [16-18] 16 (11/20 0801) BP: (108-126)/(48-61) 126/61 (11/20 0801) SpO2:  [95 %-96 %] 96 % (11/20 0801)     Height: 5\' 7"  (170.2 cm) Weight: 108.9 kg BMI (Calculated): 37.58   Intake/Output this shift:   Intake/Output Summary (Last 24 hours) at 08/28/2021 1003 Last data filed at 08/28/2021 0324 Gross per 24 hour  Intake 800 ml  Output 910 ml  Net -110 ml    Constitutional :  alert, cooperative, appears stated age, and mild distress  Respiratory:  clear to auscultation bilaterally  Cardiovascular:  regular rate and rhythm  Gastrointestinal: Soft, no guarding, tenderness to palpation along the left lower quadrant and suprapubic region as expected.  Minimal tenderness in the remaining quadrants and no complaint of diffuse Peritonitis .  JP with serosanguineous discharge, still  slightly more sanguinous.  Overall remains unchanged  Skin: Cool and moist.  Dressings intact.  Psychiatric: Normal affect, non-agitated, not confused  Eye: Resolved.    LABS:  CMP Latest Ref Rng & Units 08/28/2021 08/27/2021  08/26/2021  Glucose 70 - 99 mg/dL 95 89 118(H)  BUN 8 - 23 mg/dL 20 25(H) 23  Creatinine 0.44 - 1.00 mg/dL 1.02(H) 1.35(H) 2.10(H)  Sodium 135 - 145 mmol/L 140 140 136  Potassium 3.5 - 5.1 mmol/L 3.9 4.2 5.0  Chloride 98 - 111 mmol/L 112(H) 113(H) 108  CO2 22 - 32 mmol/L 22 24 21(L)  Calcium 8.9 - 10.3 mg/dL 8.3(L) 8.2(L) 8.2(L)  Total Protein 6.5 - 8.1 g/dL - - -  Total Bilirubin 0.3 - 1.2 mg/dL - - -  Alkaline Phos 38 - 126 U/L - - -  AST 15 - 41 U/L - - -  ALT 0 - 44 U/L - - -   CBC Latest Ref Rng & Units 08/28/2021 08/27/2021 08/26/2021  WBC 4.0 - 10.5 K/uL 6.1 6.7 10.1  Hemoglobin 12.0 - 15.0 g/dL 10.2(L) 9.7(L) 11.2(L)  Hematocrit 36.0 - 46.0 % 31.1(L) 29.2(L) 34.3(L)  Platelets 150 - 400 K/uL 93(L) 91(L) 119(L)    RADS: N/a Assessment:   S/p robotic hand assisted  lap sigmoidectomy  Corneal abrasion appreciate consult.  Continue ointment as recommended.  Increasing creatinine -   continues to improve.  We will restart Entresto once within normal limits.  Hold potassium supplement as levels remain within normal limits  Advance diet as tolerated and hopefully home in the next day or 2 once creatinine normalizes.  Hemoglobin has stabilized.  We will restart Lovenox.

## 2021-08-29 LAB — CBC WITH DIFFERENTIAL/PLATELET
Abs Immature Granulocytes: 0.02 10*3/uL (ref 0.00–0.07)
Basophils Absolute: 0 10*3/uL (ref 0.0–0.1)
Basophils Relative: 0 %
Eosinophils Absolute: 0.2 10*3/uL (ref 0.0–0.5)
Eosinophils Relative: 4 %
HCT: 29 % — ABNORMAL LOW (ref 36.0–46.0)
Hemoglobin: 9.9 g/dL — ABNORMAL LOW (ref 12.0–15.0)
Immature Granulocytes: 0 %
Lymphocytes Relative: 24 %
Lymphs Abs: 1.5 10*3/uL (ref 0.7–4.0)
MCH: 27.5 pg (ref 26.0–34.0)
MCHC: 34.1 g/dL (ref 30.0–36.0)
MCV: 80.6 fL (ref 80.0–100.0)
Monocytes Absolute: 0.5 10*3/uL (ref 0.1–1.0)
Monocytes Relative: 8 %
Neutro Abs: 3.9 10*3/uL (ref 1.7–7.7)
Neutrophils Relative %: 64 %
Platelets: 103 10*3/uL — ABNORMAL LOW (ref 150–400)
RBC: 3.6 MIL/uL — ABNORMAL LOW (ref 3.87–5.11)
RDW: 15.8 % — ABNORMAL HIGH (ref 11.5–15.5)
WBC: 6.1 10*3/uL (ref 4.0–10.5)
nRBC: 0 % (ref 0.0–0.2)

## 2021-08-29 LAB — BASIC METABOLIC PANEL
Anion gap: 7 (ref 5–15)
BUN: 13 mg/dL (ref 8–23)
CO2: 23 mmol/L (ref 22–32)
Calcium: 8.3 mg/dL — ABNORMAL LOW (ref 8.9–10.3)
Chloride: 107 mmol/L (ref 98–111)
Creatinine, Ser: 0.86 mg/dL (ref 0.44–1.00)
GFR, Estimated: >60 mL/min (ref >60)
Glucose, Bld: 101 mg/dL — ABNORMAL HIGH (ref 70–99)
Potassium: 3.6 mmol/L (ref 3.5–5.1)
Sodium: 137 mmol/L (ref 135–145)

## 2021-08-29 LAB — SURGICAL PATHOLOGY

## 2021-08-29 MED ORDER — HYDROCODONE-ACETAMINOPHEN 5-325 MG PO TABS
1.0000 | ORAL_TABLET | Freq: Four times a day (QID) | ORAL | 0 refills | Status: DC | PRN
Start: 2021-08-29 — End: 2022-11-06

## 2021-08-29 MED ORDER — ACETAMINOPHEN 325 MG PO TABS: 650.0000 mg | ORAL_TABLET | Freq: Three times a day (TID) | ORAL | 0 refills | Status: AC | PRN

## 2021-08-29 MED ORDER — ERYTHROMYCIN 5 MG/GM OP OINT: TOPICAL_OINTMENT | Freq: Three times a day (TID) | OPHTHALMIC | 0 refills | Status: AC

## 2021-08-29 NOTE — Progress Notes (Signed)
Ann Parker to be [name]'d Home per MD order.  Discussed prescriptions and follow up appointments with the patient. Prescriptions given to patient, medication list explained in detail. Pt verbalized understanding.  Allergies as of 08/29/2021       Reactions   Codeine Itching, Other (See Comments)   Sensation of something crawling all over her   Nyquil Hbp Cold & Flu [dm-doxylamine-acetaminophen] Other (See Comments)   Pravastatin Other (See Comments)   Muscle Pain   Seroquel [quetiapine] Other (See Comments)        Medication List     STOP taking these medications    dicyclomine 10 MG capsule Commonly known as: Bentyl   meclizine 25 MG tablet Commonly known as: ANTIVERT   Suprep Bowel Prep Kit 17.5-3.13-1.6 GM/177ML Soln Generic drug: Na Sulfate-K Sulfate-Mg Sulf       TAKE these medications    acetaminophen 325 MG tablet Commonly known as: Tylenol Take 2 tablets (650 mg total) by mouth every 8 (eight) hours as needed for mild pain.   albuterol 108 (90 Base) MCG/ACT inhaler Commonly known as: VENTOLIN HFA Inhale 1-2 puffs into the lungs every 6 (six) hours as needed for wheezing or shortness of breath.   budesonide-formoterol 160-4.5 MCG/ACT inhaler Commonly known as: SYMBICORT Inhale 2 puffs into the lungs 2 (two) times daily as needed (wheezing/shortness of breath).   citalopram 40 MG tablet Commonly known as: CELEXA Take 40 mg by mouth in the morning.   Entresto 24-26 MG Generic drug: sacubitril-valsartan Take 1 tablet by mouth 2 (two) times daily.   erythromycin ophthalmic ointment Place into the left eye every 8 (eight) hours.   ezetimibe 10 MG tablet Commonly known as: ZETIA Take 10 mg by mouth in the morning.   furosemide 40 MG tablet Commonly known as: LASIX Take 40 mg by mouth daily as needed (fluid retention.).   HYDROcodone-acetaminophen 5-325 MG tablet Commonly known as: Norco Take 1 tablet by mouth every 6 (six) hours as needed for up  to 6 doses for moderate pain.   metoprolol succinate 50 MG 24 hr tablet Commonly known as: TOPROL-XL Take 50 mg by mouth in the morning. Take with or immediately following a meal.   potassium chloride 10 MEQ tablet Commonly known as: KLOR-CON Take 10 mEq by mouth in the morning.   pregabalin 150 MG capsule Commonly known as: LYRICA Take 150 mg by mouth 3 (three) times daily.   rosuvastatin 5 MG tablet Commonly known as: CRESTOR Take 5 mg by mouth in the morning.   traMADol 50 MG tablet Commonly known as: Ultram Take 1 tablet (50 mg total) by mouth every 6 (six) hours as needed.   traZODone 150 MG tablet Commonly known as: DESYREL Take 150 mg by mouth at bedtime.        Vitals:   08/29/21 0414 08/29/21 0756  BP: (!) 113/50 110/60  Pulse: 61 (!) 57  Resp: 15 16  Temp: 98.4 F (36.9 C) 97.7 F (36.5 C)  SpO2: 97% 96%    Skin clean, dry and intact without evidence of skin break down, no evidence of skin tears noted. IV catheter discontinued intact. Site without signs and symptoms of complications. Dressing and pressure applied. Pt denies pain at this time. No complaints noted.  An After Visit Summary was printed and given to the patient. Patient escorted via [place], and [name] home via private auto.  Ann C. Ann Parker

## 2021-08-29 NOTE — Discharge Instructions (Signed)
Laparoscopic Colectomy, Care After This sheet gives you information about how to care for yourself after your procedure. Your health care provider may also give you more specific instructions. If you have problems or questions, contact your health care provider. What can I expect after the procedure? After your procedure, it is common to have the following: Pain in your abdomen, especially in the incision areas. You will be given medicine to control the pain. Tiredness. This is a normal part of the recovery process. Your energy level will return to normal over the next several weeks. Changes in your bowel movements, such as constipation or needing to go more often. Talk with your health care provider about how to manage this. Follow these instructions at home: Medicines  tylenol as needed for discomfort.    Use narcotics, if prescribed, only when tylenol and motrin is not enough to control pain.  325-650mg  every 8hrs to max of 4000mg /24hrs (including the 325mg  in every norco dose) for the tylenol.    Do not drive or use heavy machinery while taking prescription pain medicine. Do not drink alcohol while taking prescription pain medicine. If you were prescribed an antibiotic medicine, use it as told by your health care provider. Do not stop using the antibiotic even if you start to feel better. Incision care    Follow instructions from your health care provider about how to take care of your incision areas. Make sure you: Keep your incisions clean and dry. Wash your hands with soap and water before and after applying medicine to the areas, and before and after changing your bandage (dressing). If soap and water are not available, use hand sanitizer. Change your dressing as told by your health care provider. Leave stitches (sutures), skin glue, or adhesive strips in place. These skin closures may need to stay in place for 2 weeks or longer. If adhesive strip edges start to loosen and curl up, you  may trim the loose edges. Do not remove adhesive strips completely unless your health care provider tells you to do that. Do not wear tight clothing over the incisions. Tight clothing may rub and irritate the incision areas, which may cause the incisions to open. Do not take baths, swim, or use a hot tub until your health care provider approves. OK TO SHOWER.   Empty JP bulb every am and record amount as instructed Check your incision area every day for signs of infection. Check for: More redness, swelling, or pain. More fluid or blood. Warmth. Pus or a bad smell. Activity Avoid lifting anything that is heavier than 10 lb (4.5 kg) for 2 weeks or until your health care provider says it is okay. You may resume normal activities as told by your health care provider. Ask your health care provider what activities are safe for you. Take rest breaks during the day as needed. Eating and drinking Follow instructions from your health care provider about what you can eat after surgery. To prevent or treat constipation while you are taking prescription pain medicine, your health care provider may recommend that you: Drink enough fluid to keep your urine clear or pale yellow. Take over-the-counter or prescription medicines. Eat foods that are high in fiber, such as fresh fruits and vegetables, whole grains, and beans. Limit foods that are high in fat and processed sugars, such as fried and sweet foods. General instructions Ask your health care provider when you will need an appointment to get your sutures or staples removed. Keep all follow-up  visits as told by your health care provider. This is important. Contact a health care provider if: You have more redness, swelling, or pain around your incisions. You have more fluid or blood coming from the incisions. Your incisions feel warm to the touch. You have pus or a bad smell coming from your incisions or your dressing. You have a fever. You have an  incision that breaks open (edges not staying together) after sutures or staples have been removed. Get help right away if: You develop a rash. You have chest pain or difficulty breathing. You have pain or swelling in your legs. You feel light-headed or you faint. Your abdomen swells (becomes distended). You have nausea or vomiting. You have blood in your stool (feces). This information is not intended to replace advice given to you by your health care provider. Make sure you discuss any questions you have with your health care provider. Document Released: 04/14/2005 Document Revised: 06/14/2018 Document Reviewed: 06/26/2016 Elsevier Interactive Patient Education  2019 [employer].

## 2021-08-29 NOTE — Discharge Summary (Signed)
Physician Discharge Summary  Patient ID: Ann Parker MRN: [id number] DOB/AGE: 72-02-1951 72 y.o.  Admit date: 08/25/2021 Discharge date: 08/29/21  Admission Diagnoses: diverticulitis  Discharge Diagnoses:  Same as above  Discharged Condition: good  Hospital Course: admitted for above. Underwent elective surgery.  Please see op note for details.  Post op, increased Cr level noted that resolved with IVF, holding nephrotoxic drugs.  Bowel movement recorded and diet slowly advanced as tolerated.    Pt also had left eye pain and irritation post op, started on erythro ointment and resolved.  Ophtho consulted initially due to increasing pain since onset, but no additional recs provided.   At time of d/c, tolerating diet and pain controlled  Consults: ophtho  Discharge Exam: Blood pressure 110/60, pulse (!) 57, temperature 97.7 F (36.5 C), temperature source Oral, resp. rate 16, height '5\' 7"'  (1.702 m), weight 108.9 kg, SpO2 96 %. General appearance: alert, cooperative, and no distress GI: soft, minimal TTP in suprapubic region. Stapled incisions all c/d/I. JP with serosanguinous fluid.  Disposition:  Discharge disposition: 01-Home or Self Care       Discharge Instructions     Discharge patient   Complete by: As directed    Discharge disposition: 01-Home or Self Care   Discharge patient date: 08/29/2021      Allergies as of 08/29/2021       Reactions   Codeine Itching, Other (See Comments)   Sensation of something crawling all over her   Nyquil Hbp Cold & Flu [dm-doxylamine-acetaminophen] Other (See Comments)   Pravastatin Other (See Comments)   Muscle Pain   Seroquel [quetiapine] Other (See Comments)        Medication List     STOP taking these medications    dicyclomine 10 MG capsule Commonly known as: Bentyl   meclizine 25 MG tablet Commonly known as: ANTIVERT   Suprep Bowel Prep Kit 17.5-3.13-1.6 GM/177ML Soln Generic drug: Na Sulfate-K  Sulfate-Mg Sulf       TAKE these medications    acetaminophen 325 MG tablet Commonly known as: Tylenol Take 2 tablets (650 mg total) by mouth every 8 (eight) hours as needed for mild pain.   albuterol 108 (90 Base) MCG/ACT inhaler Commonly known as: VENTOLIN HFA Inhale 1-2 puffs into the lungs every 6 (six) hours as needed for wheezing or shortness of breath.   budesonide-formoterol 160-4.5 MCG/ACT inhaler Commonly known as: SYMBICORT Inhale 2 puffs into the lungs 2 (two) times daily as needed (wheezing/shortness of breath).   citalopram 40 MG tablet Commonly known as: CELEXA Take 40 mg by mouth in the morning.   Entresto 24-26 MG Generic drug: sacubitril-valsartan Take 1 tablet by mouth 2 (two) times daily.   erythromycin ophthalmic ointment Place into the left eye every 8 (eight) hours.   ezetimibe 10 MG tablet Commonly known as: ZETIA Take 10 mg by mouth in the morning.   furosemide 40 MG tablet Commonly known as: LASIX Take 40 mg by mouth daily as needed (fluid retention.).   HYDROcodone-acetaminophen 5-325 MG tablet Commonly known as: Norco Take 1 tablet by mouth every 6 (six) hours as needed for up to 6 doses for moderate pain.   metoprolol succinate 50 MG 24 hr tablet Commonly known as: TOPROL-XL Take 50 mg by mouth in the morning. Take with or immediately following a meal.   potassium chloride 10 MEQ tablet Commonly known as: KLOR-CON Take 10 mEq by mouth in the morning.   pregabalin 150 MG capsule Commonly known  as: LYRICA Take 150 mg by mouth 3 (three) times daily.   rosuvastatin 5 MG tablet Commonly known as: CRESTOR Take 5 mg by mouth in the morning.   traMADol 50 MG tablet Commonly known as: Ultram Take 1 tablet (50 mg total) by mouth every 6 (six) hours as needed.   traZODone 150 MG tablet Commonly known as: DESYREL Take 150 mg by mouth at bedtime.        Follow-up Information     Djuana Littleton, DO Follow up in 1 week(s).    Specialty: Surgery Why: drain and possible staple removal Contact information: [address] [place] [address] [phone]                  Total time spent arranging discharge was >35mn. Signed: IBenjamine Sprague11/21/2022, 12:15 PM

## 2021-09-09 DIAGNOSIS — R197 Diarrhea, unspecified: Secondary | ICD-10-CM | POA: Diagnosis not present

## 2021-11-03 DIAGNOSIS — I10 Essential (primary) hypertension: Secondary | ICD-10-CM | POA: Diagnosis not present

## 2021-11-03 DIAGNOSIS — E039 Hypothyroidism, unspecified: Secondary | ICD-10-CM | POA: Diagnosis not present

## 2021-11-09 DIAGNOSIS — N1832 Chronic kidney disease, stage 3b: Secondary | ICD-10-CM | POA: Diagnosis not present

## 2021-11-09 DIAGNOSIS — Z0001 Encounter for general adult medical examination with abnormal findings: Secondary | ICD-10-CM | POA: Diagnosis not present

## 2021-11-09 DIAGNOSIS — Z0000 Encounter for general adult medical examination without abnormal findings: Secondary | ICD-10-CM | POA: Diagnosis not present

## 2021-11-09 DIAGNOSIS — E039 Hypothyroidism, unspecified: Secondary | ICD-10-CM | POA: Diagnosis not present

## 2021-11-09 DIAGNOSIS — I5022 Chronic systolic (congestive) heart failure: Secondary | ICD-10-CM | POA: Diagnosis not present

## 2021-11-09 DIAGNOSIS — Z1389 Encounter for screening for other disorder: Secondary | ICD-10-CM | POA: Diagnosis not present

## 2021-11-09 DIAGNOSIS — F325 Major depressive disorder, single episode, in full remission: Secondary | ICD-10-CM | POA: Diagnosis not present

## 2021-11-09 DIAGNOSIS — I130 Hypertensive heart and chronic kidney disease with heart failure and stage 1 through stage 4 chronic kidney disease, or unspecified chronic kidney disease: Secondary | ICD-10-CM | POA: Diagnosis not present

## 2021-11-24 DIAGNOSIS — E782 Mixed hyperlipidemia: Secondary | ICD-10-CM | POA: Diagnosis not present

## 2021-11-24 DIAGNOSIS — I10 Essential (primary) hypertension: Secondary | ICD-10-CM | POA: Diagnosis not present

## 2021-11-24 DIAGNOSIS — I5022 Chronic systolic (congestive) heart failure: Secondary | ICD-10-CM | POA: Diagnosis not present

## 2021-11-24 DIAGNOSIS — I2510 Atherosclerotic heart disease of native coronary artery without angina pectoris: Secondary | ICD-10-CM | POA: Diagnosis not present

## 2021-11-24 DIAGNOSIS — Z6841 Body Mass Index (BMI) 40.0 and over, adult: Secondary | ICD-10-CM | POA: Diagnosis not present

## 2021-11-24 DIAGNOSIS — N1832 Chronic kidney disease, stage 3b: Secondary | ICD-10-CM | POA: Diagnosis not present

## 2022-02-20 DIAGNOSIS — M170 Bilateral primary osteoarthritis of knee: Secondary | ICD-10-CM | POA: Diagnosis not present

## 2022-03-27 DIAGNOSIS — U071 COVID-19: Secondary | ICD-10-CM | POA: Diagnosis not present

## 2022-04-20 DIAGNOSIS — R0602 Shortness of breath: Secondary | ICD-10-CM | POA: Diagnosis not present

## 2022-04-20 DIAGNOSIS — U071 COVID-19: Secondary | ICD-10-CM | POA: Diagnosis not present

## 2022-05-02 DIAGNOSIS — E039 Hypothyroidism, unspecified: Secondary | ICD-10-CM | POA: Diagnosis not present

## 2022-05-09 DIAGNOSIS — Z87891 Personal history of nicotine dependence: Secondary | ICD-10-CM | POA: Diagnosis not present

## 2022-05-09 DIAGNOSIS — I5022 Chronic systolic (congestive) heart failure: Secondary | ICD-10-CM | POA: Diagnosis not present

## 2022-05-09 DIAGNOSIS — I2510 Atherosclerotic heart disease of native coronary artery without angina pectoris: Secondary | ICD-10-CM | POA: Diagnosis not present

## 2022-05-09 DIAGNOSIS — I130 Hypertensive heart and chronic kidney disease with heart failure and stage 1 through stage 4 chronic kidney disease, or unspecified chronic kidney disease: Secondary | ICD-10-CM | POA: Diagnosis not present

## 2022-05-09 DIAGNOSIS — F325 Major depressive disorder, single episode, in full remission: Secondary | ICD-10-CM | POA: Diagnosis not present

## 2022-05-09 DIAGNOSIS — F419 Anxiety disorder, unspecified: Secondary | ICD-10-CM | POA: Diagnosis not present

## 2022-05-09 DIAGNOSIS — E782 Mixed hyperlipidemia: Secondary | ICD-10-CM | POA: Diagnosis not present

## 2022-05-09 DIAGNOSIS — N1832 Chronic kidney disease, stage 3b: Secondary | ICD-10-CM | POA: Diagnosis not present

## 2022-05-17 DIAGNOSIS — I10 Essential (primary) hypertension: Secondary | ICD-10-CM | POA: Diagnosis not present

## 2022-05-17 DIAGNOSIS — I5022 Chronic systolic (congestive) heart failure: Secondary | ICD-10-CM | POA: Diagnosis not present

## 2022-05-17 DIAGNOSIS — I2510 Atherosclerotic heart disease of native coronary artery without angina pectoris: Secondary | ICD-10-CM | POA: Diagnosis not present

## 2022-05-17 DIAGNOSIS — N1832 Chronic kidney disease, stage 3b: Secondary | ICD-10-CM | POA: Diagnosis not present

## 2022-05-17 DIAGNOSIS — E782 Mixed hyperlipidemia: Secondary | ICD-10-CM | POA: Diagnosis not present

## 2022-06-22 ENCOUNTER — Encounter
Admission: RE | Disposition: A | Discharge status: Home / Self Care | Payer: Self-pay | Source: Home / Self Care | Attending: Surgery

## 2022-06-22 ENCOUNTER — Encounter: Payer: Self-pay | Admitting: Surgery

## 2022-06-22 ENCOUNTER — Ambulatory Visit: Payer: Medicare HMO | Admitting: Anesthesiology

## 2022-06-22 ENCOUNTER — Ambulatory Visit
Admission: RE | Admit: 2022-06-22 | Discharge: 2022-06-22 | Disposition: A | Discharge status: Home / Self Care | Payer: Medicare HMO | Attending: Surgery | Admitting: Surgery

## 2022-06-22 DIAGNOSIS — I110 Hypertensive heart disease with heart failure: Secondary | ICD-10-CM | POA: Diagnosis not present

## 2022-06-22 DIAGNOSIS — F419 Anxiety disorder, unspecified: Secondary | ICD-10-CM | POA: Insufficient documentation

## 2022-06-22 DIAGNOSIS — Z951 Presence of aortocoronary bypass graft: Secondary | ICD-10-CM | POA: Diagnosis not present

## 2022-06-22 DIAGNOSIS — K5790 Diverticulosis of intestine, part unspecified, without perforation or abscess without bleeding: Secondary | ICD-10-CM | POA: Diagnosis not present

## 2022-06-22 DIAGNOSIS — K640 First degree hemorrhoids: Secondary | ICD-10-CM | POA: Diagnosis not present

## 2022-06-22 DIAGNOSIS — Z87891 Personal history of nicotine dependence: Secondary | ICD-10-CM | POA: Insufficient documentation

## 2022-06-22 DIAGNOSIS — Z853 Personal history of malignant neoplasm of breast: Secondary | ICD-10-CM | POA: Diagnosis not present

## 2022-06-22 DIAGNOSIS — Z980 Intestinal bypass and anastomosis status: Secondary | ICD-10-CM | POA: Diagnosis not present

## 2022-06-22 DIAGNOSIS — I2510 Atherosclerotic heart disease of native coronary artery without angina pectoris: Secondary | ICD-10-CM | POA: Insufficient documentation

## 2022-06-22 DIAGNOSIS — K219 Gastro-esophageal reflux disease without esophagitis: Secondary | ICD-10-CM | POA: Diagnosis not present

## 2022-06-22 DIAGNOSIS — Z9049 Acquired absence of other specified parts of digestive tract: Secondary | ICD-10-CM | POA: Diagnosis not present

## 2022-06-22 DIAGNOSIS — Z9221 Personal history of antineoplastic chemotherapy: Secondary | ICD-10-CM | POA: Insufficient documentation

## 2022-06-22 DIAGNOSIS — K5732 Diverticulitis of large intestine without perforation or abscess without bleeding: Secondary | ICD-10-CM | POA: Diagnosis not present

## 2022-06-22 DIAGNOSIS — Z923 Personal history of irradiation: Secondary | ICD-10-CM | POA: Insufficient documentation

## 2022-06-22 DIAGNOSIS — T7840XA Allergy, unspecified, initial encounter: Secondary | ICD-10-CM | POA: Diagnosis not present

## 2022-06-22 DIAGNOSIS — I509 Heart failure, unspecified: Secondary | ICD-10-CM | POA: Diagnosis not present

## 2022-06-22 DIAGNOSIS — Z6841 Body Mass Index (BMI) 40.0 and over, adult: Secondary | ICD-10-CM | POA: Diagnosis not present

## 2022-06-22 DIAGNOSIS — F32A Depression, unspecified: Secondary | ICD-10-CM | POA: Diagnosis not present

## 2022-06-22 DIAGNOSIS — K5730 Diverticulosis of large intestine without perforation or abscess without bleeding: Secondary | ICD-10-CM | POA: Diagnosis not present

## 2022-06-22 DIAGNOSIS — E785 Hyperlipidemia, unspecified: Secondary | ICD-10-CM | POA: Diagnosis not present

## 2022-06-22 DIAGNOSIS — M797 Fibromyalgia: Secondary | ICD-10-CM | POA: Insufficient documentation

## 2022-06-22 HISTORY — PX: COLONOSCOPY WITH PROPOFOL: SHX5780

## 2022-06-22 SURGERY — COLONOSCOPY WITH PROPOFOL
Anesthesia: General

## 2022-06-22 MED ORDER — LIDOCAINE HCL (CARDIAC) PF 100 MG/5ML IV SOSY
PREFILLED_SYRINGE | INTRAVENOUS | Status: DC | PRN
  Administered 2022-06-22: 80 mg via INTRAVENOUS

## 2022-06-22 MED ORDER — MIDAZOLAM HCL 2 MG/2ML IJ SOLN
INTRAMUSCULAR | Status: AC
  Filled 2022-06-22: qty 2

## 2022-06-22 MED ORDER — PROPOFOL 500 MG/50ML IV EMUL
INTRAVENOUS | Status: DC | PRN
  Administered 2022-06-22: 50 ug/kg/min via INTRAVENOUS

## 2022-06-22 MED ORDER — MIDAZOLAM HCL 2 MG/2ML IJ SOLN
INTRAMUSCULAR | Status: DC | PRN
  Administered 2022-06-22: 2 mg via INTRAVENOUS

## 2022-06-22 MED ORDER — PROPOFOL 10 MG/ML IV BOLUS
INTRAVENOUS | Status: DC | PRN
  Administered 2022-06-22: 40 mg via INTRAVENOUS

## 2022-06-22 MED ORDER — SODIUM CHLORIDE 0.9 % IV SOLN
INTRAVENOUS | Status: DC
  Administered 2022-06-22: 1000 mL via INTRAVENOUS

## 2022-06-22 NOTE — Anesthesia Preprocedure Evaluation (Addendum)
Anesthesia Evaluation  Patient identified by MRN, date of birth, ID band Patient awake    Reviewed: Allergy & Precautions, NPO status , Patient's Chart, lab work & pertinent test results  Airway Mallampati: III  TM Distance: >3 FB Neck ROM: full    Dental  (+) Teeth Intact, Partial Upper   Pulmonary neg pulmonary ROS, shortness of breath and with exertion, Patient abstained from smoking., former smoker,    Pulmonary exam normal  + decreased breath sounds      Cardiovascular Exercise Tolerance: Good hypertension, + CAD, + CABG and + DOE  negative cardio ROS Normal cardiovascular exam Rhythm:Regular Rate:Normal     Neuro/Psych Anxiety Depression negative neurological ROS  negative psych ROS   GI/Hepatic negative GI ROS, Neg liver ROS, GERD  Medicated,  Endo/Other  negative endocrine ROSHypothyroidism   Renal/GU negative Renal ROS  negative genitourinary   Musculoskeletal  (+) Fibromyalgia -  Abdominal (+) + obese,   Peds negative pediatric ROS (+)  Hematology negative hematology ROS (+)   Anesthesia Other Findings Past Medical History: No date: Anxiety No date: Bilateral anterior knee pain 2000: Breast cancer ([place])     Comment:  left lumpectomy No date: CHF (congestive heart failure) (HCC) No date: CHF (congestive heart failure) (HCC) No date: Chronic insomnia No date: Depression No date: Diverticulitis No date: Dysphagia No date: Dyspnea No date: Fibromyalgia No date: GERD (gastroesophageal reflux disease) No date: Hyperlipidemia No date: Hypothyroidism No date: Obesity, Class III, BMI 40-49.9 (morbid obesity) (HCC) No date: Pericardial effusion 2000: Personal history of chemotherapy     Comment:  left breast cancer 2000: Personal history of radiation therapy     Comment:  left breast cancer No date: Precordial pain No date: SOB (shortness of breath) No date: Urinary incontinence in female  Past  Surgical History: 2000: BREAST BIOPSY; Left     Comment:  Positive 2000: BREAST LUMPECTOMY; Left 2000: BREAST LUMPECTOMY WITH AXILLARY LYMPH NODE DISSECTION; Left No date: BREAST SURGERY No date: CHOLECYSTECTOMY No date: COLON SURGERY 08/12/2021: COLONOSCOPY WITH PROPOFOL; N/A     Comment:  Procedure: COLONOSCOPY WITH PROPOFOL;  Surgeon:               [name], MD;  Location: [facility] ENDOSCOPY;                Service: Endoscopy;  Laterality: N/A; No date: CORONARY ARTERY BYPASS GRAFT 01/08/2017: ESOPHAGOGASTRODUODENOSCOPY (EGD) WITH PROPOFOL; N/A     Comment:  Procedure: ESOPHAGOGASTRODUODENOSCOPY (EGD) WITH               PROPOFOL;  Surgeon: Manya Silvas, MD;  Location: [facility]              ENDOSCOPY;  Service: Endoscopy;  Laterality: N/A; No date: NECK SURGERY     Comment:  discs No date: TUBAL LIGATION  BMI    Body Mass Index: 40.42 kg/m      Reproductive/Obstetrics negative OB ROS                             Anesthesia Physical Anesthesia Plan  ASA: 3  Anesthesia Plan: General   Post-op Pain Management:    Induction: Intravenous  PONV Risk Score and Plan: Propofol infusion and TIVA  Airway Management Planned: Natural Airway  Additional Equipment:   Intra-op Plan:   Post-operative Plan:   Informed Consent: I have reviewed the patients History and Physical, chart, labs and discussed the  procedure including the risks, benefits and alternatives for the proposed anesthesia with the patient or authorized representative who has indicated his/her understanding and acceptance.     Dental Advisory Given  Plan Discussed with: CRNA and Surgeon  Anesthesia Plan Comments:         Anesthesia Quick Evaluation

## 2022-06-22 NOTE — Transfer of Care (Signed)
Immediate Anesthesia Transfer of Care Note  Patient: Ann Parker  Procedure(s) Performed: COLONOSCOPY WITH PROPOFOL  Patient Location: PACU  Anesthesia Type:General  Level of Consciousness: sedated  Airway & Oxygen Therapy: Patient Spontanous Breathing and Patient connected to nasal cannula oxygen  Post-op Assessment: Report given to RN and Post -op Vital signs reviewed and stable  Post vital signs: Reviewed and stable  Last Vitals:  Vitals Value Taken Time  BP 138/73 06/22/22 1322  Temp    Pulse 62 06/22/22 1323  Resp 13 06/22/22 1323  SpO2 98 % 06/22/22 1323  Vitals shown include unvalidated device data.  Last Pain:  Vitals:   06/22/22 1200  TempSrc: Temporal  PainSc: 0-No pain         Complications: No notable events documented.

## 2022-06-22 NOTE — Op Note (Signed)
[facility] Patient Name: Ann Parker Procedure Date: 06/22/2022 12:51 PM MRN: [id number] Account #: [id number] Date of Birth: Dec 15, 1950 Admit Type: Outpatient Age: 71 Room: [facility] ENDO ROOM 3 Gender: Female Note Status: Finalized Instrument Name: Peds Colonoscope [id number] Procedure:             Colonoscopy Indications:           Diverticula Providers:             Eliseo Squires MD, MD Referring MD:          Baxter Hire, MD (Referring MD) Medicines:             Propofol per Anesthesia Complications:         No immediate complications. Procedure:             Pre-Anesthesia Assessment:                        - After reviewing the risks and benefits, the patient                         was deemed in satisfactory condition to undergo the                         procedure in an ambulatory setting.                        After obtaining informed consent, the colonoscope was                         passed under direct vision. Throughout the procedure,                         the patient's blood pressure, pulse, and oxygen                         saturations were monitored continuously. The                         Colonoscope was introduced through the anus and                         advanced to the the cecum, identified by the ileocecal                         valve. The colonoscopy was performed with ease. The                         patient tolerated the procedure well. The quality of                         the bowel preparation was fair. Findings:      The perianal and digital rectal examinations were normal.      A few small-mouthed diverticula were found in the distal descending       colon. Estimated blood loss was minimal.      There was evidence of a prior end-to-side colo-colonic anastomosis in       the distal descending colon. This was patent and was characterized by       healthy appearing mucosa. The  anastomosis was traversed.       Internal hemorrhoids were found during retroflexion. The hemorrhoids       were Grade I (internal hemorrhoids that do not prolapse). Impression:            - Preparation of the colon was fair.                        - Diverticulosis in the distal descending colon.                        - Patent end-to-side colo-colonic anastomosis,                         characterized by healthy appearing mucosa.                        - Internal hemorrhoids.                        - No specimens collected. Recommendation:        - Discharge patient to home.                        - Resume previous diet.                        - Written discharge instructions were provided to the                         patient.                        - Repeat colonoscopy in 10 years for screening                         purposes. Procedure Code(s):     --- Professional ---                        [id number], Colonoscopy, flexible; diagnostic, including                         collection of specimen(s) by brushing or washing, when                         performed (separate procedure) Diagnosis Code(s):     --- Professional ---                        K64.0, First degree hemorrhoids                        K57.30, Diverticulosis of large intestine without                         perforation or abscess without bleeding                        Z98.0, Intestinal bypass and anastomosis status CPT copyright 2019 American Medical Association. All rights reserved. The codes documented in this report are preliminary and upon coder review may  be revised to meet current compliance requirements. Dr. Sheppard Penton, MD Eliseo Squires MD, MD  06/22/2022 1:24:55 PM This report has been signed electronically. Number of Addenda: 0 Note Initiated On: 06/22/2022 12:51 PM Scope Withdrawal Time: 0 hours 11 minutes 23 seconds  Total Procedure Duration: 0 hours 14 minutes 54 seconds  Estimated Blood Loss:  Estimated blood loss: none.      [facility]

## 2022-06-22 NOTE — H&P (Signed)
Subjective:   CC: diverticulitis  HPI:  Ann Parker is a 71 y.o. female who returns for issue above.  S/p resection one year ago.  Prior colonoscopy was incomplete colonoscopy due to poor prep, recommended to return in one year for repeat.   Past Medical History:  has a past medical history of Anxiety, Bilateral anterior knee pain, Breast cancer ([place]) (2000), CHF (congestive heart failure) ([place]), CHF (congestive heart failure) ([place]), Chronic insomnia, Depression, Diverticulitis, Dysphagia, Dyspnea, Fibromyalgia, GERD (gastroesophageal reflux disease), Hyperlipidemia, Hypothyroidism, Obesity, Class III, BMI 40-49.9 (morbid obesity) ([place]), Pericardial effusion, Personal history of chemotherapy (2000), Personal history of radiation therapy (2000), Precordial pain, SOB (shortness of breath), and Urinary incontinence in female.  Past Surgical History:  Past Surgical History:  Procedure Laterality Date   BREAST BIOPSY Left 2000   Positive   BREAST LUMPECTOMY Left 2000   BREAST LUMPECTOMY WITH AXILLARY LYMPH NODE DISSECTION Left 2000   BREAST SURGERY     CHOLECYSTECTOMY     COLON SURGERY     COLONOSCOPY WITH PROPOFOL N/A 08/12/2021   Procedure: COLONOSCOPY WITH PROPOFOL;  Surgeon: [name], MD;  Location: [facility] ENDOSCOPY;  Service: Endoscopy;  Laterality: N/A;   CORONARY ARTERY BYPASS GRAFT     ESOPHAGOGASTRODUODENOSCOPY (EGD) WITH PROPOFOL N/A 01/08/2017   Procedure: ESOPHAGOGASTRODUODENOSCOPY (EGD) WITH PROPOFOL;  Surgeon: Manya Silvas, MD;  Location: [facility] ENDOSCOPY;  Service: Endoscopy;  Laterality: N/A;   NECK SURGERY     discs   TUBAL LIGATION      Family History: family history includes Breast cancer (age of onset: 61) in her daughter.  Social History:  reports that she has quit smoking. Her smoking use included cigarettes. She has never used smokeless tobacco. She reports that she does not drink alcohol and does not use drugs.  Current Medications:  Prior to  Admission medications   Medication Sig Start Date End Date Taking? Authorizing Provider  budesonide-formoterol (SYMBICORT) 160-4.5 MCG/ACT inhaler Inhale 2 puffs into the lungs 2 (two) times daily as needed (wheezing/shortness of breath).   Yes [provider]  citalopram (CELEXA) 40 MG tablet Take 40 mg by mouth in the morning. 05/30/21  Yes [provider]  ezetimibe (ZETIA) 10 MG tablet Take 10 mg by mouth in the morning. 01/27/20  Yes [provider]  furosemide (LASIX) 40 MG tablet Take 40 mg by mouth daily as needed (fluid retention.).   Yes [provider]  metoprolol succinate (TOPROL-XL) 50 MG 24 hr tablet Take 50 mg by mouth in the morning. Take with or immediately following a meal.   Yes [provider]  potassium chloride (K-DUR,KLOR-CON) 10 MEQ tablet Take 10 mEq by mouth in the morning.   Yes [provider]  pregabalin (LYRICA) 150 MG capsule Take 150 mg by mouth 3 (three) times daily.   Yes [provider]  rosuvastatin (CRESTOR) 5 MG tablet Take 5 mg by mouth in the morning. 05/25/21  Yes [provider]  traZODone (DESYREL) 150 MG tablet Take 150 mg by mouth at bedtime. 06/08/21  Yes [provider]  albuterol (PROVENTIL HFA;VENTOLIN HFA) 108 (90 Base) MCG/ACT inhaler Inhale 1-2 puffs into the lungs every 6 (six) hours as needed for wheezing or shortness of breath.    [provider]  ENTRESTO 24-26 MG Take 700 tablets by mouth 2 (two) times daily. 01/26/20   [provider]  erythromycin ophthalmic ointment Place into the left eye every 8 (eight) hours. Patient not taking: Reported  on 06/22/2022 08/29/21   Benjamine Sprague, DO  HYDROcodone-acetaminophen (NORCO) 5-325 MG tablet Take 1 tablet by mouth every 6 (six) hours as needed for up to 6 doses for moderate pain. Patient not taking: Reported on 06/22/2022 08/29/21   Benjamine Sprague, DO    Allergies:  Allergies as of 05/04/2022 - Review  Complete 08/25/2021  Allergen Reaction Noted   Codeine Itching and Other (See Comments) 09/02/2016   Nyquil hbp cold & flu [dm-doxylamine-acetaminophen] Other (See Comments) 08/11/2021   Pravastatin Other (See Comments) 08/11/2021   Seroquel [quetiapine] Other (See Comments) 08/11/2021    ROS:  General: Denies weight loss, weight gain, fatigue, fevers, chills, and night sweats. Eyes: Denies blurry vision, double vision, eye pain, itchy eyes, and tearing. Ears: Denies hearing loss, earache, and ringing in ears. Nose: Denies sinus pain, congestion, infections, runny nose, and nosebleeds. Mouth/throat: Denies hoarseness, sore throat, bleeding gums, and difficulty swallowing. Heart: Denies chest pain, palpitations, racing heart, irregular heartbeat, leg pain or swelling, and decreased activity tolerance. Respiratory: Denies breathing difficulty, shortness of breath, wheezing, cough, and sputum. GI: Denies change in appetite, heartburn, nausea, vomiting, constipation, diarrhea, and blood in stool. GU: Denies difficulty urinating, pain with urinating, urgency, frequency, blood in urine. Musculoskeletal: Denies joint stiffness, pain, swelling, muscle weakness. Skin: Denies rash, itching, mass, tumors, sores, and boils Neurologic: Denies headache, fainting, dizziness, seizures, numbness, and tingling. Psychiatric: Denies depression, anxiety, difficulty sleeping, and memory loss. Endocrine: Denies heat or cold intolerance, and increased thirst or urination. Blood/lymph: Denies easy bruising, easy bruising, and swollen glands     Objective:     BP 135/67   Pulse 70   Temp (!) 97.3 F (36.3 C) (Temporal)   Resp 16   Ht '5\' 4"'$  (1.626 m)   Wt 106.8 kg   SpO2 99%   BMI 40.42 kg/m   Constitutional :  alert, cooperative, appears stated age, and no distress  Lymphatics/Throat:  no asymmetry, masses, or scars  Respiratory:  clear to auscultation bilaterally  Cardiovascular:  regular rate and  rhythm  Gastrointestinal: soft, non-tender; bowel sounds normal; no masses,  no organomegaly.   Musculoskeletal: Steady movement  Skin: Cool and moist  Psychiatric: Normal affect, non-agitated, not confused       LABS:     Latest Ref Rng & Units 08/29/2021    7:12 AM 08/28/2021    7:01 AM 08/27/2021    5:10 AM  CMP  Glucose 70 - 99 mg/dL 101  95  89   BUN 8 - 23 mg/dL '13  20  25   '$ Creatinine 0.44 - 1.00 mg/dL 0.86  1.02  1.35   Sodium 135 - 145 mmol/L 137  140  140   Potassium 3.5 - 5.1 mmol/L 3.6  3.9  4.2   Chloride 98 - 111 mmol/L 107  112  113   CO2 22 - 32 mmol/L '23  22  24   '$ Calcium 8.9 - 10.3 mg/dL 8.3  8.3  8.2       Latest Ref Rng & Units 08/29/2021    7:12 AM 08/28/2021    7:01 AM 08/27/2021    5:10 AM  CBC  WBC 4.0 - 10.5 K/uL 6.1  6.1  6.7   Hemoglobin 12.0 - 15.0 g/dL 9.9  10.2  9.7   Hematocrit 36.0 - 46.0 % 29.0  31.1  29.2   Platelets 150 - 400 K/uL 103  93  91     RADS: N/a Assessment:   Diverticulitis, s/p  sigmoidectomy  Here for repeat due to incomplete study previously  Plan:   R/b/a discussed The patient verbalized understanding and all questions were answered to the patient's satisfaction.  labs/images/medications/previous chart entries reviewed personally and relevant changes/updates noted above.

## 2022-06-23 NOTE — Anesthesia Postprocedure Evaluation (Signed)
Anesthesia Post Note  Patient: Ann Parker  Procedure(s) Performed: COLONOSCOPY WITH PROPOFOL  Patient location during evaluation: PACU Anesthesia Type: General Level of consciousness: awake and oriented Pain management: pain level controlled Vital Signs Assessment: post-procedure vital signs reviewed and stable Respiratory status: spontaneous breathing and respiratory function stable Cardiovascular status: stable Anesthetic complications: no   No notable events documented.   Last Vitals:  Vitals:   06/22/22 1332 06/22/22 1342  BP: (!) 151/74 (!) 147/73  Pulse:    Resp:    Temp:    SpO2:      Last Pain:  Vitals:   06/22/22 1342  TempSrc:   PainSc: 0-No pain                 VAN STAVEREN,[name]

## 2022-06-26 ENCOUNTER — Encounter: Payer: Self-pay | Admitting: Surgery

## 2022-07-20 DIAGNOSIS — R0602 Shortness of breath: Secondary | ICD-10-CM | POA: Diagnosis not present

## 2022-07-20 DIAGNOSIS — E669 Obesity, unspecified: Secondary | ICD-10-CM | POA: Diagnosis not present

## 2022-07-20 DIAGNOSIS — J4520 Mild intermittent asthma, uncomplicated: Secondary | ICD-10-CM | POA: Diagnosis not present

## 2022-10-20 ENCOUNTER — Other Ambulatory Visit: Payer: Self-pay

## 2022-10-20 ENCOUNTER — Ambulatory Visit
Admission: RE | Admit: 2022-10-20 | Discharge: 2022-10-20 | Disposition: A | Discharge status: Home / Self Care | Payer: Medicare HMO | Source: Ambulatory Visit | Attending: Student | Admitting: Student

## 2022-10-20 ENCOUNTER — Other Ambulatory Visit: Payer: Self-pay | Admitting: Student

## 2022-10-20 ENCOUNTER — Emergency Department
Admission: EM | Admit: 2022-10-20 | Discharge: 2022-10-20 | Disposition: A | Discharge status: Home / Self Care | Payer: Medicare HMO | Attending: Emergency Medicine | Admitting: Emergency Medicine

## 2022-10-20 DIAGNOSIS — I824Z1 Acute embolism and thrombosis of unspecified deep veins of right distal lower extremity: Secondary | ICD-10-CM

## 2022-10-20 DIAGNOSIS — M7989 Other specified soft tissue disorders: Secondary | ICD-10-CM

## 2022-10-20 DIAGNOSIS — I509 Heart failure, unspecified: Secondary | ICD-10-CM | POA: Insufficient documentation

## 2022-10-20 DIAGNOSIS — E039 Hypothyroidism, unspecified: Secondary | ICD-10-CM | POA: Insufficient documentation

## 2022-10-20 DIAGNOSIS — Z853 Personal history of malignant neoplasm of breast: Secondary | ICD-10-CM | POA: Diagnosis not present

## 2022-10-20 DIAGNOSIS — M79604 Pain in right leg: Secondary | ICD-10-CM | POA: Diagnosis present

## 2022-10-20 MED ORDER — APIXABAN (ELIQUIS) VTE STARTER PACK (10MG AND 5MG): ORAL_TABLET | ORAL | 0 refills | Status: DC

## 2022-10-20 MED ORDER — APIXABAN 5 MG PO TABS
10.0000 mg | ORAL_TABLET | Freq: Once | ORAL | Status: AC
  Administered 2022-10-20: 10 mg via ORAL
  Filled 2022-10-20: qty 2

## 2022-10-20 MED ORDER — APIXABAN 5 MG PO TABS: 10.0000 mg | ORAL_TABLET | Freq: Two times a day (BID) | ORAL | Status: DC

## 2022-10-20 MED ORDER — APIXABAN 5 MG PO TABS: 5.0000 mg | ORAL_TABLET | Freq: Two times a day (BID) | ORAL | Status: DC

## 2022-10-20 NOTE — ED Triage Notes (Signed)
Pt c/o pain in right leg x 1 week and was DX with popliteal DVT in outpatient ultrasound. Pt AOX4, NAD noted. Pt denies blood thinners. Right knee has ace wrap at this time, CMS intact.

## 2022-10-20 NOTE — ED Provider Notes (Signed)
[facility] Provider Note    Event Date/Time   First MD Initiated Contact with Patient 10/20/22 1728     (approximate)   History   Leg Pain (Popliteal DVT by [place])   HPI  Ann Parker is a 72 y.o. female with history of CHF, GERD, hypothyroidism, breast cancer presents emergency department with right leg pain.  [facility] placed Ace wrap on the knee and sent her for ultrasound.  Patient was sent over by ultrasound due to positive DVT.  Patient denies chest pain/shortness of breath.  States pain in the leg has been for approximately 1 week      Physical Exam   Triage Vital Signs: ED Triage Vitals  Enc Vitals Group     BP 10/20/22 1623 118/60     Pulse Rate 10/20/22 1621 67     Resp 10/20/22 1621 18     Temp 10/20/22 1621 98.4 F (36.9 C)     Temp src --      SpO2 10/20/22 1621 96 %     Weight --      Height --      Head Circumference --      Peak Flow --      Pain Score 10/20/22 1621 10     Pain Loc --      Pain Edu? --      Excl. in [place]? --     Most recent vital signs: Vitals:   10/20/22 1621 10/20/22 1623  BP:  118/60  Pulse: 67   Resp: 18   Temp: 98.4 F (36.9 C)   SpO2: 96%      General: Awake, no distress.   CV:  Good peripheral perfusion. regular rate and  rhythm Resp:  Normal effort. Lungs cta Abd:  No distention.   Other:  Right leg tender along the popliteal area, neurovascular is intact   ED Results / Procedures / Treatments   Labs (all labs ordered are listed, but only abnormal results are displayed) Labs Reviewed - No data to display   EKG     RADIOLOGY Ultrasound right lower extremity from outpatient    PROCEDURES:   Procedures   MEDICATIONS ORDERED IN ED: Medications  apixaban (ELIQUIS) tablet 10 mg (has no administration in time range)    Followed by  apixaban (ELIQUIS) tablet 5 mg (has no administration in time range)     IMPRESSION / MDM / ASSESSMENT AND  PLAN / ED COURSE  I reviewed the triage vital signs and the nursing notes.                              Differential diagnosis includes, but is not limited to, DVT, cellulitis, knee pain  Patient's presentation is most consistent with acute complicated illness / injury requiring diagnostic workup.   I did review the ultrasound, outpatient which states she has a popliteal nonocclusive DVT.  Consult to vascular: Spoke with Eulogio Ditch, NP, states start Eliquis follow-up and 2 weeks in the office for repeat ultrasound.  I did give the patient a dose of Eliquis while here in the ED.  She is to follow-up with her regular doctor, follow-up with vascular, return if worsening.  In agreement treatment plan.  She was discharged stable condition.      FINAL CLINICAL IMPRESSION(S) / ED DIAGNOSES   Final diagnoses:  DVT, lower extremity, distal, acute, right ([place])  Rx / DC Orders   ED Discharge Orders          Ordered    APIXABAN (ELIQUIS) VTE STARTER PACK ('10MG'$  AND '5MG'$ )        10/20/22 1756             Note:  This document was prepared using Dragon voice recognition software and may include unintentional dictation errors.    Versie Starks, PA-C 10/20/22 1830    Harvest Dark, MD 10/20/22 Kathyrn Drown

## 2022-10-20 NOTE — Discharge Instructions (Signed)
Follow-up with vascular.  Please call him Tuesday for an appointment.  Their office may be open on Monday and if they could make the appointment on Monday.  Take the Eliquis as prescribed.  Return emergency department if you are worsening or become short of breath

## 2022-10-20 NOTE — Progress Notes (Signed)
ANTICOAGULATION CONSULT NOTE - Initial Consult  Pharmacy Consult for apixaban Indication: DVT  Allergies  Allergen Reactions   Codeine Itching and Other (See Comments)    Sensation of something crawling all over her   Nyquil Hbp Cold & Flu [Dm-Doxylamine-Acetaminophen] Other (See Comments)   Pravastatin Other (See Comments)    Muscle Pain   Seroquel [Quetiapine] Other (See Comments)    Patient Measurements:    Vital Signs: Temp: 98.4 F (36.9 C) (01/12 1621) BP: 118/60 (01/12 1623) Pulse Rate: 67 (01/12 1621)  Labs: No results for input(s): "HGB", "HCT", "PLT", "APTT", "LABPROT", "INR", "HEPARINUNFRC", "HEPRLOWMOCWT", "CREATININE", "CKTOTAL", "CKMB", "TROPONINIHS" in the last 72 hours.  CrCl cannot be calculated (Patient's most recent lab result is older than the maximum 21 days allowed.).   Medical History: Past Medical History:  Diagnosis Date   Anxiety    Bilateral anterior knee pain    Breast cancer ([place]) 2000   left lumpectomy   CHF (congestive heart failure) (HCC)    CHF (congestive heart failure) (HCC)    Chronic insomnia    Depression    Diverticulitis    Dysphagia    Dyspnea    Fibromyalgia    GERD (gastroesophageal reflux disease)    Hyperlipidemia    Hypothyroidism    Obesity, Class III, BMI 40-49.9 (morbid obesity) (HCC)    Pericardial effusion    Personal history of chemotherapy 2000   left breast cancer   Personal history of radiation therapy 2000   left breast cancer   Precordial pain    SOB (shortness of breath)    Urinary incontinence in female     Medications: No anticoagulation (per my review of fill history)  Assessment: 72 year old female presenting to ED with popliteal DVT.   No dose adjustment required for indication (regardless of renal function).  Goal of Therapy:  Monitor platelets by anticoagulation protocol: Yes   Plan:  Apixaban (Eliquis) 10 mg BID x 7 days followed by apixaban (Eliquis) 5 mg BID thereafter.     Glean Salvo, PharmD, BCPS Clinical Pharmacist  10/20/2022 6:09 PM

## 2022-10-30 ENCOUNTER — Emergency Department: Payer: Medicare HMO

## 2022-10-30 ENCOUNTER — Other Ambulatory Visit: Payer: Self-pay

## 2022-10-30 ENCOUNTER — Emergency Department
Admission: EM | Admit: 2022-10-30 | Discharge: 2022-10-30 | Disposition: A | Discharge status: Home / Self Care | Payer: Medicare HMO | Attending: Emergency Medicine | Admitting: Emergency Medicine

## 2022-10-30 ENCOUNTER — Encounter: Payer: Self-pay | Admitting: Emergency Medicine

## 2022-10-30 DIAGNOSIS — Z7901 Long term (current) use of anticoagulants: Secondary | ICD-10-CM | POA: Diagnosis not present

## 2022-10-30 DIAGNOSIS — Z853 Personal history of malignant neoplasm of breast: Secondary | ICD-10-CM | POA: Insufficient documentation

## 2022-10-30 DIAGNOSIS — M79604 Pain in right leg: Secondary | ICD-10-CM

## 2022-10-30 DIAGNOSIS — I509 Heart failure, unspecified: Secondary | ICD-10-CM | POA: Insufficient documentation

## 2022-10-30 DIAGNOSIS — I82431 Acute embolism and thrombosis of right popliteal vein: Secondary | ICD-10-CM | POA: Insufficient documentation

## 2022-10-30 LAB — BASIC METABOLIC PANEL
Anion gap: 8 (ref 5–15)
BUN: 21 mg/dL (ref 8–23)
CO2: 25 mmol/L (ref 22–32)
Calcium: 8.6 mg/dL — ABNORMAL LOW (ref 8.9–10.3)
Chloride: 108 mmol/L (ref 98–111)
Creatinine, Ser: 1.2 mg/dL — ABNORMAL HIGH (ref 0.44–1.00)
GFR, Estimated: 48 mL/min — ABNORMAL LOW (ref >60)
Glucose, Bld: 98 mg/dL (ref 70–99)
Potassium: 4.1 mmol/L (ref 3.5–5.1)
Sodium: 141 mmol/L (ref 135–145)

## 2022-10-30 LAB — CBC WITH DIFFERENTIAL/PLATELET
Abs Immature Granulocytes: 0.04 10*3/uL (ref 0.00–0.07)
Basophils Absolute: 0 10*3/uL (ref 0.0–0.1)
Basophils Relative: 0 %
Eosinophils Absolute: 0.2 10*3/uL (ref 0.0–0.5)
Eosinophils Relative: 2 %
HCT: 38.9 % (ref 36.0–46.0)
Hemoglobin: 12.6 g/dL (ref 12.0–15.0)
Immature Granulocytes: 0 %
Lymphocytes Relative: 27 %
Lymphs Abs: 2.4 10*3/uL (ref 0.7–4.0)
MCH: 27.8 pg (ref 26.0–34.0)
MCHC: 32.4 g/dL (ref 30.0–36.0)
MCV: 85.9 fL (ref 80.0–100.0)
Monocytes Absolute: 0.6 10*3/uL (ref 0.1–1.0)
Monocytes Relative: 6 %
Neutro Abs: 5.7 10*3/uL (ref 1.7–7.7)
Neutrophils Relative %: 65 %
Platelets: 139 10*3/uL — ABNORMAL LOW (ref 150–400)
RBC: 4.53 MIL/uL (ref 3.87–5.11)
RDW: 14.5 % (ref 11.5–15.5)
WBC: 8.9 10*3/uL (ref 4.0–10.5)
nRBC: 0 % (ref 0.0–0.2)

## 2022-10-30 LAB — TROPONIN I (HIGH SENSITIVITY): Troponin I (High Sensitivity): 3 ng/L (ref <18)

## 2022-10-30 MED ORDER — OXYCODONE-ACETAMINOPHEN 5-325 MG PO TABS
1.0000 | ORAL_TABLET | Freq: Once | ORAL | Status: AC
  Administered 2022-10-30: 1 via ORAL
  Filled 2022-10-30: qty 1

## 2022-10-30 MED ORDER — OXYCODONE-ACETAMINOPHEN 5-325 MG PO TABS: 1.0000 | ORAL_TABLET | Freq: Four times a day (QID) | ORAL | 0 refills | Status: DC | PRN

## 2022-10-30 MED ORDER — IOHEXOL 350 MG/ML SOLN
75.0000 mL | Freq: Once | INTRAVENOUS | Status: AC | PRN
  Administered 2022-10-30: 75 mL via INTRAVENOUS

## 2022-10-30 NOTE — ED Provider Triage Note (Signed)
Emergency Medicine Provider Triage Evaluation Note  Ann Parker , a 72 y.o. female  was evaluated in triage.  Pt complains of rightleg pain. Diagnosed with DVT 2 weeks ago, put on eliquis, and was getting beter and then got worse 2 days. No skin changes. As soon as she finished the first week of eliquis (the double dose week) her pain worsened. Has a history of blood clots in legs only, no history of PE.  Review of Systems  Positive: Right leg pain Negative: Paresthesias, cp/sob  Physical Exam  There were no vitals taken for this visit. Gen:   Awake, no distress   Resp:  Normal effort  MSK:   Moves extremities without difficulty  Other:  Unable to visualize in triage giving leggings  Medical Decision Making  Medically screening exam initiated at 2:12 PM.  Appropriate orders placed.  Ann Parker was informed that the remainder of the evaluation will be completed by another provider, this initial triage assessment does not replace that evaluation, and the importance of remaining in the ED until their evaluation is complete.     Ann Old, PA-C 10/30/22 1415

## 2022-10-30 NOTE — ED Triage Notes (Signed)
Pt presents to the ED via POV. Pt was here 2wks ago for a confirmed blood clot in the leg. Pt was put on Eliquis and symptoms improved but is now back due to swelling and pain being worse. Pt also c/o trouble breathing. Pt has hx of blood clots in the legs.

## 2022-10-30 NOTE — ED Provider Notes (Signed)
[facility] Provider Note    Event Date/Time   First MD Initiated Contact with Patient 10/30/22 1600     (approximate)   History   Chief Complaint Leg Pain   HPI  Ann Parker is a 72 y.o. female with past medical history of hyperlipidemia, CHF, breast cancer, fibromyalgia, and DVT on Eliquis who presents to the ED complaining of leg pain.  Patient reports that she was seen in the ED 10 days ago for right leg pain and swelling, was diagnosed with DVT at that time and started on Eliquis.  She has been compliant with Eliquis dosing, completed the first week of treatment and reduced dose as instructed by starter pack.  She states that the pain and swelling in her leg initially seemed to improve, but she has dealt with increasing pain and swelling over the past 2 to 3 days.  This been associated with some difficulty breathing, but she denies any pain in her chest.  She has had a cough productive of clear sputum, but denies any fevers.  She has not noticed any pain or swelling in her left leg.  She has an appointment scheduled with vascular surgery for later this month.     Physical Exam   Triage Vital Signs: ED Triage Vitals  Enc Vitals Group     BP 10/30/22 1413 128/62     Pulse Rate 10/30/22 1413 64     Resp 10/30/22 1413 18     Temp 10/30/22 1413 (!) 97.5 F (36.4 C)     Temp Source 10/30/22 1413 Oral     SpO2 10/30/22 1413 97 %     Weight 10/30/22 1414 240 lb (108.9 kg)     Height 10/30/22 1414 '5\' 7"'$  (1.702 m)     Head Circumference --      Peak Flow --      Pain Score 10/30/22 1413 9     Pain Loc --      Pain Edu? --      Excl. in [place]? --     Most recent vital signs: Vitals:   10/30/22 1413  BP: 128/62  Pulse: 64  Resp: 18  Temp: (!) 97.5 F (36.4 C)  SpO2: 97%    Constitutional: Alert and oriented. Eyes: Conjunctivae are normal. Head: Atraumatic. Nose: No congestion/rhinnorhea. Mouth/Throat: Mucous membranes are moist.   Cardiovascular: Normal rate, regular rhythm. Grossly normal heart sounds.  2+ radial and DP pulses bilaterally. Respiratory: Normal respiratory effort.  No retractions. Lungs CTAB. Gastrointestinal: Soft and nontender. No distention. Musculoskeletal: Firm edema noted to right lower extremity with associated calf tenderness, no erythema or other skin discoloration.  No left lower extremity edema or tenderness noted. Neurologic:  Normal speech and language. No gross focal neurologic deficits are appreciated.    ED Results / Procedures / Treatments   Labs (all labs ordered are listed, but only abnormal results are displayed) Labs Reviewed  CBC WITH DIFFERENTIAL/PLATELET - Abnormal; Notable for the following components:      Result Value   Platelets 139 (*)    All other components within normal limits  BASIC METABOLIC PANEL - Abnormal; Notable for the following components:   Creatinine, Ser 1.20 (*)    Calcium 8.6 (*)    GFR, Estimated 48 (*)    All other components within normal limits  TROPONIN I (HIGH SENSITIVITY)     EKG  ED ECG REPORT I, Blake Divine, the attending physician, personally viewed and interpreted  this ECG.   Date: 10/30/2022  EKG Time: 14:16  Rate: 63  Rhythm: normal sinus rhythm  Axis: LAD  Intervals:first-degree A-V block   ST&T Change: None  RADIOLOGY CTA chest reviewed and interpreted by me with no pulmonary embolism or focal infiltrate noted.  PROCEDURES:  Critical Care performed: No  Procedures   MEDICATIONS ORDERED IN ED: Medications  oxyCODONE-acetaminophen (PERCOCET/ROXICET) 5-325 MG per tablet 1 tablet (1 tablet Oral Given 10/30/22 1706)  iohexol (OMNIPAQUE) 350 MG/ML injection 75 mL (75 mLs Intravenous Contrast Given 10/30/22 1717)     IMPRESSION / MDM / ASSESSMENT AND PLAN / ED COURSE  I reviewed the triage vital signs and the nursing notes.                              72 y.o. female with past medical history of hyperlipidemia,  CHF, breast cancer, fibromyalgia, and DVT on Eliquis who presents to the ED complaining of increasing right lower extremity pain and swelling as well as difficulty breathing and cough since starting on Eliquis 10 days ago.  Patient's presentation is most consistent with acute presentation with potential threat to life or bodily function.  Differential diagnosis includes, but is not limited to, worsening DVT, PE, phlegmasia cerulea dolens, medication noncompliance, anemia, electrolyte abnormality.  Patient well-appearing and in no acute distress, vital signs are unremarkable.  She has ongoing edema and tenderness to palpation of her right lower extremity, but remains neurovascularly intact distally with strong DP pulse and no clinical signs of phlegmasia.  We will further assess with CTA of her chest to rule out PE, EKG shows no evidence of arrhythmia or ischemia and labs are pending at this time.  We will treat pain with Percocet and reassess following labs and imaging.  CTA chest negative for PE or other acute process.  Case reviewed with Dr. Laurence Compton of vascular surgery, patient's lower extremity ultrasound read demonstrates DVT in the right popliteal vein, although this has gone from nonocclusive to occlusive per report.  Per Dr. Lucky Cowboy, this would not necessarily represent treatment failure and he recommends keeping patient on Eliquis until her schedule follow-up in 1 week.  He does state that patient may be offered change to Xarelto if she is concerned about this.  I had extensive conversation with patient and daughter regarding potential change of anticoagulation, but they are comfortable with continuing with Eliquis for now.  Will prescribe short course of pain medication and patient counseled to keep her scheduled appointment with vascular surgery, otherwise return to the ED for new or worsening symptoms.  Patient and daughter agree with plan.      FINAL CLINICAL IMPRESSION(S) / ED DIAGNOSES   Final  diagnoses:  Right leg pain  Deep vein thrombosis (DVT) of popliteal vein of right lower extremity, unspecified chronicity ([place])     Rx / DC Orders   ED Discharge Orders          Ordered    oxyCODONE-acetaminophen (PERCOCET) 5-325 MG tablet  Every 6 hours PRN        10/30/22 1853             Note:  This document was prepared using Dragon voice recognition software and may include unintentional dictation errors.   Blake Divine, MD 10/30/22 [phone]

## 2022-11-02 ENCOUNTER — Telehealth: Payer: Self-pay

## 2022-11-02 NOTE — Telephone Encounter (Signed)
        Patient  visited [facility] on 10/30/2022  for Right leg pain.   Telephone encounter attempt :  1st  Unable to leave message voicemail full.   [place] Resource Care Guide   [email or web address]  ?? [phone]   [email or web address]: [email or web address]  [email or web address]

## 2022-11-06 ENCOUNTER — Telehealth: Payer: Self-pay

## 2022-11-06 ENCOUNTER — Encounter (INDEPENDENT_AMBULATORY_CARE_PROVIDER_SITE_OTHER): Payer: Self-pay | Admitting: Vascular Surgery

## 2022-11-06 ENCOUNTER — Ambulatory Visit (INDEPENDENT_AMBULATORY_CARE_PROVIDER_SITE_OTHER): Payer: Medicare HMO | Admitting: Vascular Surgery

## 2022-11-06 VITALS — BP 117/66 | HR 64 | Ht 67.0 in | Wt 251.0 lb

## 2022-11-06 DIAGNOSIS — I82431 Acute embolism and thrombosis of right popliteal vein: Secondary | ICD-10-CM | POA: Diagnosis not present

## 2022-11-06 DIAGNOSIS — E782 Mixed hyperlipidemia: Secondary | ICD-10-CM | POA: Diagnosis not present

## 2022-11-06 DIAGNOSIS — I5022 Chronic systolic (congestive) heart failure: Secondary | ICD-10-CM | POA: Diagnosis not present

## 2022-11-06 DIAGNOSIS — K219 Gastro-esophageal reflux disease without esophagitis: Secondary | ICD-10-CM | POA: Diagnosis not present

## 2022-11-06 DIAGNOSIS — I82409 Acute embolism and thrombosis of unspecified deep veins of unspecified lower extremity: Secondary | ICD-10-CM | POA: Insufficient documentation

## 2022-11-06 MED ORDER — APIXABAN 5 MG PO TABS: 5.0000 mg | ORAL_TABLET | Freq: Two times a day (BID) | ORAL | 5 refills | Status: DC

## 2022-11-06 MED ORDER — APIXABAN 5 MG PO TABS: 5.0000 mg | ORAL_TABLET | Freq: Two times a day (BID) | ORAL | 0 refills | Status: DC

## 2022-11-06 MED ORDER — HYDROCODONE-ACETAMINOPHEN 5-325 MG PO TABS: 1.0000 | ORAL_TABLET | Freq: Four times a day (QID) | ORAL | 0 refills | Status: AC | PRN

## 2022-11-06 NOTE — Progress Notes (Signed)
MRN : [id number]  Ann Parker is a 72 y.o. (07/05/1951) female who presents with chief complaint of legs hurt and swell.  History of Present Illness:   The patient presents to the office for evaluation of DVT.  DVT was identified at the [facility] ER on 10/20/2022 by Duplex ultrasound.  The initial symptoms were pain and swelling in the lower extremity.  Duplex ultrasound is reviewed by me and demonstrates acute occlusive thrombus in the right popliteal.  The patient notes the leg continues to be very painful with dependency and swells quite a bite.  Symptoms are better with elevation.  The patient notes minimal edema in the morning which steadily worsens throughout the day.    The patient has not been using compression therapy at this point.  No SOB or pleuritic chest pains.  No cough or hemoptysis.  No blood per rectum or blood in any sputum.  No excessive bruising per the patient.   No recent shortening of the patient's walking distance or new symptoms consistent with claudication.  No history of rest pain symptoms. No new ulcers or wounds of the lower extremities have occurred.  The patient denies amaurosis fugax or recent TIA symptoms. There are no recent neurological changes noted. No recent episodes of angina or shortness of breath documented.   Current Meds  Medication Sig   albuterol (PROVENTIL HFA;VENTOLIN HFA) 108 (90 Base) MCG/ACT inhaler Inhale 1-2 puffs into the lungs every 6 (six) hours as needed for wheezing or shortness of breath.   apixaban (ELIQUIS) 5 MG TABS tablet Take 1 tablet (5 mg total) by mouth 2 (two) times daily.   budesonide-formoterol (SYMBICORT) 160-4.5 MCG/ACT inhaler Inhale 2 puffs into the lungs 2 (two) times daily as needed (wheezing/shortness of breath).   citalopram (CELEXA) 40 MG tablet Take 40 mg by mouth in the morning.   ENTRESTO 24-26 MG Take 700 tablets by mouth 2 (two) times daily.   erythromycin ophthalmic ointment Place into the  left eye every 8 (eight) hours.   ezetimibe (ZETIA) 10 MG tablet Take 10 mg by mouth in the morning.   furosemide (LASIX) 40 MG tablet Take 40 mg by mouth daily as needed (fluid retention.).   metoprolol succinate (TOPROL-XL) 50 MG 24 hr tablet Take 50 mg by mouth in the morning. Take with or immediately following a meal.   potassium chloride (K-DUR,KLOR-CON) 10 MEQ tablet Take 10 mEq by mouth in the morning.   pregabalin (LYRICA) 150 MG capsule Take 150 mg by mouth 3 (three) times daily.   rosuvastatin (CRESTOR) 5 MG tablet Take 5 mg by mouth in the morning.   traZODone (DESYREL) 150 MG tablet Take 150 mg by mouth at bedtime.   [DISCONTINUED] APIXABAN (ELIQUIS) VTE STARTER PACK ('10MG'$  AND '5MG'$ ) Take as directed on package: start with two-'5mg'$  tablets twice daily for 7 days. On day 8, switch to one-'5mg'$  tablet twice daily.   [DISCONTINUED] HYDROcodone-acetaminophen (NORCO) 5-325 MG tablet Take 1 tablet by mouth every 6 (six) hours as needed for up to 6 doses for moderate pain.   [DISCONTINUED] oxyCODONE-acetaminophen (PERCOCET) 5-325 MG tablet Take 1 tablet by mouth every 6 (six) hours as needed for severe pain.    Past Medical History:  Diagnosis Date   Anxiety    Bilateral anterior knee pain    Breast cancer ([place]) 2000   left lumpectomy   CHF (congestive heart failure) (HCC)    CHF (congestive heart  failure) (HCC)    Chronic insomnia    Depression    Diverticulitis    Dysphagia    Dyspnea    Fibromyalgia    GERD (gastroesophageal reflux disease)    Hyperlipidemia    Hypothyroidism    Obesity, Class III, BMI 40-49.9 (morbid obesity) (HCC)    Pericardial effusion    Personal history of chemotherapy 2000   left breast cancer   Personal history of radiation therapy 2000   left breast cancer   Precordial pain    SOB (shortness of breath)    Urinary incontinence in female     Past Surgical History:  Procedure Laterality Date   BREAST BIOPSY Left 2000   Positive   BREAST  LUMPECTOMY Left 2000   BREAST LUMPECTOMY WITH AXILLARY LYMPH NODE DISSECTION Left 2000   BREAST SURGERY     CHOLECYSTECTOMY     COLON SURGERY     COLONOSCOPY WITH PROPOFOL N/A 08/12/2021   Procedure: COLONOSCOPY WITH PROPOFOL;  Surgeon: [name], MD;  Location: [place] [facility];  Service: Endoscopy;  Laterality: N/A;   COLONOSCOPY WITH PROPOFOL N/A 06/22/2022   Procedure: COLONOSCOPY WITH PROPOFOL;  Surgeon: Benjamine Sprague, DO;  Location: [place] [facility];  Service: General;  Laterality: N/A;   CORONARY ARTERY BYPASS GRAFT     ESOPHAGOGASTRODUODENOSCOPY (EGD) WITH PROPOFOL N/A 01/08/2017   Procedure: ESOPHAGOGASTRODUODENOSCOPY (EGD) WITH PROPOFOL;  Surgeon: Manya Silvas, MD;  Location: [facility] ENDOSCOPY;  Service: Endoscopy;  Laterality: N/A;   NECK SURGERY     discs   TUBAL LIGATION      Social History Social History   Tobacco Use   Smoking status: Former    Packs/day: 0.50    Years: 5.00    Total pack years: 2.50    Types: Cigarettes    Start date: 10/09/1973    Quit date: 10/09/1978    Years since quitting: 44.1   Smokeless tobacco: Never  Vaping Use   Vaping Use: Never used  Substance Use Topics   Alcohol use: No   Drug use: No    Family History Family History  Problem Relation Age of Onset   Breast cancer Daughter 22    Allergies  Allergen Reactions   Codeine Itching and Other (See Comments)    Sensation of something crawling all over her   Nyquil Hbp Cold & Flu [Dm-Doxylamine-Acetaminophen] Other (See Comments)   Pravastatin Other (See Comments)    Muscle Pain   Seroquel [Quetiapine] Other (See Comments)     REVIEW OF SYSTEMS (Negative unless checked)  Constitutional: '[]'$ Weight loss  '[]'$ Fever  '[]'$ Chills Cardiac: '[]'$ Chest pain   '[]'$ Chest pressure   '[]'$ Palpitations   '[]'$ Shortness of breath when laying flat   '[]'$ Shortness of breath with exertion. Vascular:  '[]'$ Pain in legs with walking   '[x]'$ Pain in legs at rest  '[]'$ History of DVT   '[]'$ Phlebitis   '[x]'$ Swelling in legs    '[]'$ Varicose veins   '[]'$ Non-healing ulcers Pulmonary:   '[]'$ Uses home oxygen   '[]'$ Productive cough   '[]'$ Hemoptysis   '[]'$ Wheeze  '[]'$ COPD   '[]'$ Asthma Neurologic:  '[]'$ Dizziness   '[]'$ Seizures   '[]'$ History of stroke   '[]'$ History of TIA  '[]'$ Aphasia   '[]'$ Vissual changes   '[]'$ Weakness or numbness in arm   '[]'$ Weakness or numbness in leg Musculoskeletal:   '[]'$ Joint swelling   '[]'$ Joint pain   '[]'$ Low back pain Hematologic:  '[]'$ Easy bruising  '[]'$ Easy bleeding   '[]'$ Hypercoagulable state   '[]'$ Anemic Gastrointestinal:  '[]'$ Diarrhea   '[]'$ Vomiting  '[]'$ Gastroesophageal reflux/heartburn   '[]'$   Difficulty swallowing. Genitourinary:  '[]'$ Chronic kidney disease   '[]'$ Difficult urination  '[]'$ Frequent urination   '[]'$ Blood in urine Skin:  '[]'$ Rashes   '[]'$ Ulcers  Psychological:  '[]'$ History of anxiety   '[]'$  History of major depression.  Physical Examination  Vitals:   11/06/22 1405  BP: 117/66  Pulse: 64  Weight: 251 lb (113.9 kg)  Height: '5\' 7"'$  (1.702 m)   Body mass index is 39.31 kg/m. Gen: WD/WN, NAD Head: [place]/AT, No temporalis wasting.  Ear/Nose/Throat: Hearing grossly intact, nares w/o erythema or drainage, pinna without lesions Eyes: PER, EOMI, sclera nonicteric.  Neck: Supple, no gross masses.  No JVD.  Pulmonary:  Good air movement, no audible wheezing, no use of accessory muscles.  Cardiac: RRR, precordium not hyperdynamic. Vascular:  scattered varicosities present bilaterally.  Moderate venous stasis changes to the legs bilaterally.  3+ soft pitting edema right leg 1+ left Vessel Right Left  Radial Palpable Palpable  Gastrointestinal: soft, non-distended. No guarding/no peritoneal signs.  Musculoskeletal: M/S 5/5 throughout.  No deformity.  Neurologic: CN 2-12 intact. Pain and light touch intact in extremities.  Symmetrical.  Speech is fluent. Motor exam as listed above. Psychiatric: Judgment intact, Mood & affect appropriate for pt's clinical situation. Dermatologic: Venous rashes no ulcers noted.  No changes consistent with  cellulitis. Lymph : No lichenification or skin changes of chronic lymphedema.  CBC Lab Results  Component Value Date   WBC 8.9 10/30/2022   HGB 12.6 10/30/2022   HCT 38.9 10/30/2022   MCV 85.9 10/30/2022   PLT 139 (L) 10/30/2022    BMET    Component Value Date/Time   NA 141 10/30/2022 1418   K 4.1 10/30/2022 1418   CL 108 10/30/2022 1418   CO2 25 10/30/2022 1418   GLUCOSE 98 10/30/2022 1418   BUN 21 10/30/2022 1418   CREATININE 1.20 (H) 10/30/2022 1418   CALCIUM 8.6 (L) 10/30/2022 1418   GFRNONAA 48 (L) 10/30/2022 1418   GFRAA 58 (L) 02/10/2020 1705   Estimated Creatinine Clearance: 53.6 mL/min (A) (by C-G formula based on SCr of 1.2 mg/dL (H)).  COAG No results found for: "INR", "PROTIME"  Radiology CT Angio Chest PE W/Cm &/Or Wo Cm  Result Date: 10/30/2022 CLINICAL DATA:  Pulmonary embolus suspected EXAM: CT ANGIOGRAPHY CHEST WITH CONTRAST TECHNIQUE: Multidetector CT imaging of the chest was performed using the standard protocol during bolus administration of intravenous contrast. Multiplanar CT image reconstructions and MIPs were obtained to evaluate the vascular anatomy. RADIATION DOSE REDUCTION: This exam was performed according to the departmental dose-optimization program which includes automated exposure control, adjustment of the mA and/or kV according to patient size and/or use of iterative reconstruction technique. CONTRAST:  51m OMNIPAQUE IOHEXOL 350 MG/ML SOLN COMPARISON:  Chest CT dated February 14th 2018 FINDINGS: Cardiovascular: No evidence of pulmonary embolus. Normal heart size. No pericardial effusion. Normal caliber thoracic aorta with mild calcified plaque. Moderate coronary artery calcifications. Mediastinum/Nodes: Esophagus and thyroid are unremarkable. Left internal mammary lymph node measuring 8 mm on series 6, image 144, unchanged when compared with the prior exam. Left anterior diaphragmatic lymph node measuring 7 mm on series 6, image 213, unchanged  when compared with Feb 14, 2021 prior. Surgical clips of the left axilla. Lungs/Pleura: Central airways are patent. Bibasilar atelectasis. No consolidation, pleural effusion or pneumothorax. Upper Abdomen: No acute abnormality. Musculoskeletal: Postsurgical changes of the left breast. No acute or significant osseous findings. Review of the MIP images confirms the above findings. IMPRESSION: 1. No evidence of pulmonary embolus  or acute airspace opacity. 2. Aortic Atherosclerosis (ICD10-I70.0). Electronically Signed   By: Yetta Glassman M.D.   On: 10/30/2022 18:00   US Venous Img Lower Right (DVT Study)  Result Date: 10/30/2022 CLINICAL DATA:  Right lower extremity pain and swelling. EXAM: Right LOWER EXTREMITY VENOUS DOPPLER ULTRASOUND TECHNIQUE: Gray-scale sonography with graded compression, as well as color Doppler and duplex ultrasound were performed to evaluate the lower extremity deep venous systems from the level of the common femoral vein and including the common femoral, femoral, profunda femoral, popliteal and calf veins including the posterior tibial, peroneal and gastrocnemius veins when visible. The superficial great saphenous vein was also interrogated. Spectral Doppler was utilized to evaluate flow at rest and with distal augmentation maneuvers in the common femoral, femoral and popliteal veins. COMPARISON:  October 20, 2022. FINDINGS: Contralateral Common Femoral Vein: Respiratory phasicity is normal and symmetric with the symptomatic side. No evidence of thrombus. Normal compressibility. Common Femoral Vein: No evidence of thrombus. Normal compressibility, respiratory phasicity and response to augmentation. Saphenofemoral Junction: No evidence of thrombus. Normal compressibility and flow on color Doppler imaging. Profunda Femoral Vein: No evidence of thrombus. Normal compressibility and flow on color Doppler imaging. Femoral Vein: No evidence of thrombus. Normal compressibility, respiratory  phasicity and response to augmentation. Popliteal Vein: Noncompressible with no flow consistent with occlusive thrombus. Calf Veins: Calf veins are not well visualized. Superficial Great Saphenous Vein: No evidence of thrombus. Normal compressibility. Venous Reflux:  None. Other Findings:  None. IMPRESSION: Right popliteal deep venous thrombosis is noted as described on recent ultrasound. Electronically Signed   By: Marijo Conception M.D.   On: 10/30/2022 15:47   US Venous Img Lower Unilateral Right (DVT)  Result Date: 10/20/2022 CLINICAL DATA:  Right lower extremity swelling EXAM: Right LOWER EXTREMITY VENOUS DOPPLER ULTRASOUND TECHNIQUE: Gray-scale sonography with graded compression, as well as color Doppler and duplex ultrasound were performed to evaluate the lower extremity deep venous systems from the level of the common femoral vein and including the common femoral, femoral, profunda femoral, popliteal and calf veins including the posterior tibial, peroneal and gastrocnemius veins when visible. The superficial great saphenous vein was also interrogated. Spectral Doppler was utilized to evaluate flow at rest and with distal augmentation maneuvers in the common femoral, femoral and popliteal veins. COMPARISON:  None Available. FINDINGS: Contralateral Common Femoral Vein: Respiratory phasicity is normal and symmetric with the symptomatic side. No evidence of thrombus. Normal compressibility. Common Femoral Vein: No evidence of thrombus. Normal compressibility, respiratory phasicity and response to augmentation. Saphenofemoral Junction: No evidence of thrombus. Normal compressibility and flow on color Doppler imaging. Profunda Femoral Vein: No evidence of thrombus. Normal compressibility and flow on color Doppler imaging. Femoral Vein: No evidence of thrombus. Normal compressibility, respiratory phasicity and response to augmentation. Popliteal Vein: Positive for nonocclusive thrombus. Incompletely compressible.  Calf Veins: No evidence of thrombus. Normal compressibility and flow on color Doppler imaging. Limited visibility. Superficial Great Saphenous Vein: No evidence of thrombus. Normal compressibility. IMPRESSION: Positive for nonocclusive right popliteal acute DVT. These results will be called to the ordering clinician or representative by the Radiologist Assistant, and communication documented in the PACS or [employer]. Electronically Signed   By: Donavan Foil M.D.   On: 10/20/2022 16:03     Assessment/Plan 1. Acute deep vein thrombosis (DVT) of popliteal vein of right lower extremity (HCC) Recommend:   No surgery or intervention at this point in time.  IVC filter is not indicated at present.  Patient's  duplex ultrasound of the venous system shows DVT in the popliteal vein.  The patient is initiated on anticoagulation.  She will continue Eliquis for minimum 6 months  Elevation was stressed, such as the use of a recliner.  I have discussed  DVT and post phlebitic changes such as swelling and why it  causes symptoms such as pain.  The patient should wear graduated compression stockings beginning after three full days of anticoagulation.  The compression should be worn on a daily basis. The patient should wearing the stockings first thing in the morning and removing them in the evening. The patient should not to sleep in the stockings.  In addition, behavioral modification including elevation during the day and avoidance of prolonged dependency will be initiated.    The patient will continue anticoagulation for now as there have not been any problems or complications at this point.   2. Gastroesophageal reflux disease without esophagitis Continue PPI as already ordered, this medication has been reviewed and there are no changes at this time.  Avoidence of caffeine and alcohol  Moderate elevation of the head of the bed   3. Chronic systolic CHF (congestive heart failure), NYHA class 3  (HCC) Continue cardiac and antihypertensive medications as already ordered and reviewed, no changes at this time.  Continue statin as ordered and reviewed, no changes at this time  Diuretics as needed  4. Mixed hyperlipidemia Continue statin as ordered and reviewed, no changes at this time    Hortencia Pilar, MD  11/06/2022 9:50 PM

## 2022-11-06 NOTE — Telephone Encounter (Signed)
        Patient  visited [facility] on 10/30/2022  for Right leg pain.   Telephone encounter attempt :  2nd  A HIPAA compliant voice message was left requesting a return call.  Instructed patient to call back at [phone].   [place] Resource Care Guide   [email or web address]  ?? [phone]   Website: [email or web address]  [email or web address]

## 2022-11-07 ENCOUNTER — Telehealth: Payer: Self-pay

## 2022-11-07 NOTE — Telephone Encounter (Signed)
        Patient  visited [facility] on 10/30/2022  for Right leg pain.   Telephone encounter attempt :  3rd  A HIPAA compliant voice message was left requesting a return call.  Instructed patient to call back at [phone].   [place] Resource Care Guide   [email or web address]  ?? [phone]   Website: [email or web address]  [email or web address]

## 2023-01-01 ENCOUNTER — Other Ambulatory Visit: Payer: Self-pay | Admitting: Internal Medicine

## 2023-01-01 DIAGNOSIS — Z1231 Encounter for screening mammogram for malignant neoplasm of breast: Secondary | ICD-10-CM

## 2023-01-12 ENCOUNTER — Ambulatory Visit
Admission: RE | Admit: 2023-01-12 | Discharge: 2023-01-12 | Disposition: A | Discharge status: Home / Self Care | Payer: Medicare HMO | Source: Ambulatory Visit | Attending: Internal Medicine | Admitting: Internal Medicine

## 2023-01-12 ENCOUNTER — Inpatient Hospital Stay: Payer: Medicare HMO | Attending: Oncology

## 2023-01-12 ENCOUNTER — Inpatient Hospital Stay: Payer: Medicare HMO | Admitting: Licensed Clinical Social Worker

## 2023-01-12 ENCOUNTER — Encounter: Payer: Self-pay | Admitting: Licensed Clinical Social Worker

## 2023-01-12 DIAGNOSIS — Z87891 Personal history of nicotine dependence: Secondary | ICD-10-CM | POA: Insufficient documentation

## 2023-01-12 DIAGNOSIS — Z853 Personal history of malignant neoplasm of breast: Secondary | ICD-10-CM

## 2023-01-12 DIAGNOSIS — Z1231 Encounter for screening mammogram for malignant neoplasm of breast: Secondary | ICD-10-CM | POA: Diagnosis present

## 2023-01-12 DIAGNOSIS — Z7901 Long term (current) use of anticoagulants: Secondary | ICD-10-CM | POA: Insufficient documentation

## 2023-01-12 DIAGNOSIS — Z79811 Long term (current) use of aromatase inhibitors: Secondary | ICD-10-CM | POA: Insufficient documentation

## 2023-01-12 DIAGNOSIS — Z8481 Family history of carrier of genetic disease: Secondary | ICD-10-CM

## 2023-01-12 DIAGNOSIS — Z803 Family history of malignant neoplasm of breast: Secondary | ICD-10-CM

## 2023-01-12 DIAGNOSIS — Z86718 Personal history of other venous thrombosis and embolism: Secondary | ICD-10-CM | POA: Insufficient documentation

## 2023-01-12 DIAGNOSIS — Z1501 Genetic susceptibility to malignant neoplasm of breast: Secondary | ICD-10-CM | POA: Insufficient documentation

## 2023-01-12 NOTE — Progress Notes (Signed)
REFERRING PROVIDER: Self-referred  PRIMARY PROVIDER:  Gracelyn Parker, Ann D, MD  PRIMARY REASON FOR VISIT:  1. Personal history of breast cancer   2. Family history of breast cancer   3. Family history of breast cancer gene mutation in first degree relative      HISTORY OF PRESENT ILLNESS:   [name], a 72 y.o. female, was seen for a [place] cancer genetics consultation due to her personal history of cancer and her daughter's recent genetic testing that showed a CHEK2 mutation.  [name] presents to clinic today to discuss the possibility of a hereditary predisposition to cancer, genetic testing, and to further clarify her future cancer risks, as well as potential cancer risks for family members.   CANCER HISTORY:  At the age of 72, [name] was diagnosed with breast cancer. She was [place] for [place] in [place] lum[place], chemotherapy and radiation.   Past Medical History:  Diagnosis Date   Anxiety    Bilateral anterior knee pain    Breast cancer 2000   left lumpectomy   CHF (congestive heart failure)    CHF (congestive heart failure)    Chronic insomnia    Depression    Diverticulitis    Dysphagia    Dyspnea    Fibromyalgia    GERD (gastroesophageal reflux disease)    Hyperlipidemia    Hypothyroidism    Obesity, Class III, BMI 40-49.9 (morbid obesity)    Pericardial effusion    Personal history of chemotherapy 2000   left breast cancer   Personal history of radiation therapy 2000   left breast cancer   Precordial pain    SOB (shortness of breath)    Urinary incontinence in female     Past Surgical History:  Procedure Laterality Date   BREAST BIOPSY Left 2000   Positive   BREAST LUMPECTOMY Left 2000   BREAST LUMPECTOMY WITH AXILLARY LYMPH NODE DISSECTION Left 2000   BREAST SURGERY     CHOLECYSTECTOMY     COLON SURGERY     COLONOSCOPY WITH PROPOFOL N/A 08/12/2021   Procedure: COLONOSCOPY WITH PROPOFOL;  Surgeon: Ann Parker, Ann T, MD;  Location:  [facility] ENDOSCOPY;  Service: Endoscopy;  Laterality: N/A;   COLONOSCOPY WITH PROPOFOL N/A 06/22/2022   Procedure: COLONOSCOPY WITH PROPOFOL;  Surgeon: Ann Parker, Isami, DO;  Location: [facility] ENDOSCOPY;  Service: General;  Laterality: N/A;   CORONARY ARTERY BYPASS GRAFT     ESOPHAGOGASTRODUODENOSCOPY (EGD) WITH PROPOFOL N/A 01/08/2017   Procedure: ESOPHAGOGASTRODUODENOSCOPY (EGD) WITH PROPOFOL;  Surgeon: Ann Junobert Parker Elliott, MD;  Location: [facility] ENDOSCOPY;  Service: Endoscopy;  Laterality: N/A;   NECK SURGERY     discs   TUBAL LIGATION      FAMILY HISTORY:  We obtained a detailed, 4-generation family history.  Significant diagnoses are listed below: Family History  Problem Relation Age of Onset   Breast cancer Daughter 5940   Breast cancer Daughter 6850       CHEK2+   [name] has 2 daughters. One had breast cancer at 2337, and her other daughter was recently diagnosed at age 74. She had genetic testing that showed a CHEK2 mutation.  [name]'s mother died in her 5970s. Her father died at 5768. No other known cancers in the family.  [name] is aware of previous family history of genetic testing for hereditary cancer risks. There is no reported Ashkenazi Jewish ancestry. There is no known consanguinity.    GENETIC COUNSELING ASSESSMENT: [name] is a 72 y.o. female with  a personal and family history of breast cancer and a family history of CHEK2 mutation.  We, therefore, discussed and recommended the following at today's visit.   DISCUSSION: We discussed that approximately 10% of breast cancer is hereditary. We discussed the CHEK2 gene in detail, including cancer risks and potential management changes. She has a 50% chance to have this same CHEK2 mutation.  We discussed that testing is beneficial for several reasons including knowing about cancer risks, identifying potential screening and risk-reduction options that may be appropriate, and to understand if other family members could be at risk for cancer  and allow them to undergo genetic testing.   We reviewed the characteristics, features and inheritance patterns of hereditary cancer syndromes. We also discussed genetic testing, including the appropriate family members to test, the process of testing, insurance coverage and turn-around-time for results. We discussed the implications of a negative, positive and/or variant of uncertain significant result. We recommended [name] pursue genetic testing for the CHEK2 gene.  Based on [name]'s personal and family history of cancer, she meets medical criteria for genetic testing. Her test will be covered at no cost through [employer]'s Family Variant Testing program.   PLAN: After considering the risks, benefits, and limitations, [name] provided informed consent to pursue genetic testing and the blood sample was sent to [facility] for analysis of the CHEK2 gene. Results should be available within approximately 2-3 weeks' time, at which point they will be disclosed by telephone to [name], as will any additional recommendations warranted by these results. [name] will receive a summary of her genetic counseling visit and a copy of her results once available. This information will also be available in Epic.   [name] questions were answered to her satisfaction today. Our contact information was provided should additional questions or concerns arise. Thank you for the referral and allowing [place] to share in the care of your patient.   Ann Duverney, MS, [facility] Genetic Counselor [email or web address] Phone: [phone]  The patient was seen for a total of 10 minutes in face-to-face genetic counseling.  Patient was seen with her two daughters, grandson, and son in Social worker.  Dr. Orlie Parker was available for discussion regarding this case.   _______________________________________________________________________ For Office Staff:  Number of people involved in session: 5 Was an  Intern/ student involved with case: no

## 2023-01-19 ENCOUNTER — Telehealth: Payer: Self-pay | Admitting: Licensed Clinical Social Worker

## 2023-01-19 NOTE — Telephone Encounter (Signed)
This patient called and said she is trying to get test results. She tried a card that was given to her, but it says no results. Send Ann Parker a message  Please call patent  @ [phone].

## 2023-01-22 ENCOUNTER — Telehealth: Payer: Self-pay | Admitting: Licensed Clinical Social Worker

## 2023-01-22 ENCOUNTER — Ambulatory Visit: Payer: Self-pay | Admitting: Licensed Clinical Social Worker

## 2023-01-22 ENCOUNTER — Encounter: Payer: Self-pay | Admitting: Licensed Clinical Social Worker

## 2023-01-22 DIAGNOSIS — Z1379 Encounter for other screening for genetic and chromosomal anomalies: Secondary | ICD-10-CM | POA: Insufficient documentation

## 2023-01-22 DIAGNOSIS — Z1589 Genetic susceptibility to other disease: Secondary | ICD-10-CM | POA: Insufficient documentation

## 2023-01-22 NOTE — Progress Notes (Signed)
Genetic Test Results - CHEK2+  HPI:   Ms. Berens was previously seen in the [facility] due to her daughter's genetic test results that showed a CHEK2 mutation  and concerns regarding a hereditary predisposition to cancer. Please refer to our prior cancer genetics clinic note for more information regarding our discussion, assessment and recommendations, at the time. Ms. Boyar recent genetic test results were disclosed to her, as were recommendations warranted by these results. These results and recommendations are discussed in more detail below.  CANCER HISTORY:  At the age of 45, Ms. Harb was diagnosed with breast cancer. She was treated for this in [place] with lumpectomy, chemotherapy and radiation.   FAMILY HISTORY:  We obtained a detailed, 4-generation family history.  Significant diagnoses are listed below: Family History  Problem Relation Age of Onset   Breast cancer Daughter 28   Breast cancer Daughter 83       CHEK2+   Ms. Baksh has 2 daughters. One had breast cancer at 17, and her other daughter was recently diagnosed at age 67. She had genetic testing that showed a CHEK2 mutation.   Ms. Rozier's mother died in her 47s. Her father died at 82. No other known cancers in the family.   Ms. Kashani is aware of previous family history of genetic testing for hereditary cancer risks. There is no reported Ashkenazi Jewish ancestry. There is no known consanguinity.        GENETIC TEST RESULTS:  Ms. Helzer tested positive for a single pathogenic variant (harmful genetic change) in the CHEK2 gene. Specifically, this variant is c.1100del.  The test report has been scanned into EPIC and is located under the Molecular Pathology section of the Results Review tab.  A portion of the result report is included below for reference. Genetic testing reported out on 01/20/2023.     Cancer Risks for CHEK2: Women have a 20-40% lifetime risk of breast cancer. Men are  thought to be at an increased risk of prostate cancer. The exact risk figure is unknown at this time. 8-9% lifetime risk of colorectal cancer Data suggests a possible increased risk for ovarian, endometrial, thyroid, and other types of cancer    Research is continuing to help learn more about the cancers associated with CHEK2 pathogenic variants and what the exact risks are to develop these cancers.  Management Recommendations:  Breast Cancer Screening/Risk Reduction:  Women: Breast cancer screening includes: Breast awareness beginning at age 31 Monthly self-breast examination beginning at age 74 Clinical breast examination every 6-12 months beginning at age 50 or at the age of the earliest diagnosed breast cancer in the family, if onset was before age 27 Annual mammogram with consideration of tomosynthesis starting at age 59 or 10 years prior to the youngest age of diagnosis, whichever comes first Consider breast MRI with contrast starting at age 23-35 Evidence is insufficient for a prophylactic risk-reducing mastectomy, manage based on family history  For patients who are treated for breast cancer and have not had bilateral mastectomy, screening should continue as described  Colon Cancer Screening: Colonoscopy screening every 5 years beginning at age 66 If an individual has a first-degree relative with colorectal cancer, screening should begin 10 years prior to the relative's age at diagnosis if before 8. If an individual has a personal history of colorectal cancer, screening recommendations should be based on recommendations for post-colorectal cancer resection.  Prostate Cancer Screening: Consider beginning annual PSA blood test and digital rectal exams at  age 35.   This information is based on current understanding of the gene and may change in the future.   Implications for Family Members: Hereditary predisposition to cancer due to pathogenic variants in the CHEK2 gene has  autosomal dominant inheritance. This means that an individual with a pathogenic variant has a 50% chance of passing the condition on to his/her offspring. Identification of a pathogenic variant allows for the recognition of at-risk relatives who can pursue testing for the familial variant.   Family members are encouraged to consider genetic testing for this familial pathogenic variant. As there are generally no childhood cancer risks associated with pathogenic variants in the CHEK2 gene, individuals in the family are not recommended to have testing until they reach at least 74 years of age.  Family members who live outside of the area are encouraged to find a genetic counselor in their area by visiting: [email or web address].   Resources: FORCE (Facing Our Risk of Cancer Empowered) is a resource for those with a hereditary predisposition to develop cancer.  FORCE provides information about risk reduction, advocacy, legislation, and clinical trials.  Additionally, FORCE provides a platform for collaboration and support; which includes: peer navigation, message boards, local support groups, a toll-free helpline, research registry and recruitment, advocate training, published medical research, webinars, brochures, mastectomy photos, and more.  For more information, visit www.facingourrisk.org  PLAN: 1. These results will be made available to  her PCP. She would to be referred to the high risk clinic here at the [facility] to discuss long term follow up for this indication. Referral placed today.  2. Ms. Tasso plans to discuss these results with her family and will reach out to [place] if we can be of any assistance in coordinating genetic testing for any of her relatives.    We encouraged Ms. Liverman to remain in contact with [place] on an annual basis so we can update her personal and family histories, and let her know of advances in cancer genetics that may benefit the family. Our contact  number was provided. Ms. Bielen questions were answered to her satisfaction today, and she knows she is welcome to call anytime with additional questions.   Lacy Duverney, MS, [facility] Genetic Counselor [email or web address] Phone: [phone]

## 2023-01-22 NOTE — Telephone Encounter (Signed)
I contacted Ms. Cordon to discuss her genetic testing results. The known familial pathogenic variant in CHEK2 was identified. Detailed clinic note to follow.   The test report has been scanned into EPIC and is located under the Molecular Pathology section of the Results Review tab.  A portion of the result report is included below for reference.      Lacy Duverney, MS, [facility] Genetic Counselor [email or web address] Phone: [phone]

## 2023-01-23 ENCOUNTER — Encounter: Payer: Self-pay | Admitting: Internal Medicine

## 2023-01-23 ENCOUNTER — Inpatient Hospital Stay: Payer: Medicare HMO

## 2023-01-23 ENCOUNTER — Inpatient Hospital Stay (HOSPITAL_BASED_OUTPATIENT_CLINIC_OR_DEPARTMENT_OTHER): Payer: Medicare HMO | Admitting: Internal Medicine

## 2023-01-23 VITALS — BP 112/57 | HR 60 | Temp 99.0°F | Resp 16 | Ht 67.0 in | Wt 244.5 lb

## 2023-01-23 DIAGNOSIS — Z1501 Genetic susceptibility to malignant neoplasm of breast: Secondary | ICD-10-CM | POA: Diagnosis not present

## 2023-01-23 DIAGNOSIS — Z1589 Genetic susceptibility to other disease: Secondary | ICD-10-CM

## 2023-01-23 DIAGNOSIS — Z8481 Family history of carrier of genetic disease: Secondary | ICD-10-CM

## 2023-01-23 DIAGNOSIS — Z2989 Encounter for other specified prophylactic measures: Secondary | ICD-10-CM

## 2023-01-23 DIAGNOSIS — Z86718 Personal history of other venous thrombosis and embolism: Secondary | ICD-10-CM | POA: Diagnosis not present

## 2023-01-23 DIAGNOSIS — Z87891 Personal history of nicotine dependence: Secondary | ICD-10-CM | POA: Diagnosis not present

## 2023-01-23 DIAGNOSIS — Z7901 Long term (current) use of anticoagulants: Secondary | ICD-10-CM | POA: Diagnosis not present

## 2023-01-23 DIAGNOSIS — Z853 Personal history of malignant neoplasm of breast: Secondary | ICD-10-CM | POA: Diagnosis not present

## 2023-01-23 DIAGNOSIS — Z780 Asymptomatic menopausal state: Secondary | ICD-10-CM

## 2023-01-23 DIAGNOSIS — Z79811 Long term (current) use of aromatase inhibitors: Secondary | ICD-10-CM | POA: Diagnosis not present

## 2023-01-23 DIAGNOSIS — Z803 Family history of malignant neoplasm of breast: Secondary | ICD-10-CM

## 2023-01-23 MED ORDER — ANASTROZOLE 1 MG PO TABS: 1.0000 mg | ORAL_TABLET | Freq: Every day | ORAL | 2 refills | Status: AC

## 2023-01-23 NOTE — Progress Notes (Addendum)
[facility]  [phone] [phone]  ID: Ann Parker OB: 1951-07-08  MR#: [id number]  YQM#:[id number]  Patient Care Team: Gracelyn Nurse, MD as PCP - General (Internal Medicine)  REFERRING PROVIDER: Lacy Duverney   REASON FOR REFERRAL: Discuss chemoprevention due to high risk  HPI: Ann Parker is a 72 y.o. female with past medical history of left breast cancer s/p lumpectomy, chemo, RT diagnosed in 2000 in [place], robotic sigmoidectomy due to recurrent diverticulitis and unprovoked right leg DVT on Eliquis, CHF, hyperlipidemia, depression was referred to medical oncology to discuss about chemoprevention for high risk breast.  Per patient, she was diagnosed with stage II left breast cancer with lymph node involvement in 2000.  Treated in [place].  Underwent lumpectomy then Adriamycin/Taxotere and radiation.  Reports she was briefly on tamoxifen but then had right leg DVT and tamoxifen was discontinued.  She has strong family history of breast cancer in both her daughters.  Her older daughter Ann Parker was diagnosed with stage I hormone positive breast cancer at age 73 (in 2003).  Most recently her younger daughter is diagnosed with breast cancer at age 33 and undergoing treatment.  She tested positive for CHEK2.  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  In January 2024, she was diagnosed with unprovoked right leg DVT and is on Eliquis.  She also has history of right leg DVT in 2001 secondary to tamoxifen.  She reports she took it for 6 weeks and was taken off after the block.  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  Menopause age 20  Age of menarche 73  Age at first child birth 30 OCPs yes - 4-5 years   Recurrent diverticulitus- surgery   REVIEW OF SYSTEMS:   ROS  As per HPI. Otherwise, a complete review of systems is  negative.  PAST MEDICAL HISTORY: Past Medical History:  Diagnosis Date   Anxiety    Bilateral anterior knee pain    Breast cancer 2000   left lumpectomy   CHF (congestive heart failure)    CHF (congestive heart failure)    Chronic insomnia    Depression    Diverticulitis    Dysphagia    Dyspnea    Fibromyalgia    GERD (gastroesophageal reflux disease)    Hyperlipidemia    Hypothyroidism    Obesity, Class III, BMI 40-49.9 (morbid obesity)    Pericardial effusion    Personal history of chemotherapy 2000   left breast cancer   Personal history of radiation therapy 2000   left breast cancer   Precordial pain    SOB (shortness of breath)    Urinary incontinence in female     PAST SURGICAL HISTORY: Past Surgical History:  Procedure Laterality Date   BREAST BIOPSY Left 2000   Positive   BREAST LUMPECTOMY Left 2000   BREAST LUMPECTOMY WITH AXILLARY LYMPH NODE DISSECTION Left 2000   BREAST SURGERY     CHOLECYSTECTOMY     COLON SURGERY     COLONOSCOPY WITH PROPOFOL N/A 08/12/2021   Procedure: COLONOSCOPY WITH PROPOFOL;  Surgeon: Regis Bill, MD;  Location: [facility] ENDOSCOPY;  Service: Endoscopy;  Laterality: N/A;   COLONOSCOPY WITH PROPOFOL N/A 06/22/2022   Procedure: COLONOSCOPY WITH PROPOFOL;  Surgeon: Sung Amabile, DO;  Location: [facility] ENDOSCOPY;  Service: General;  Laterality: N/A;   CORONARY ARTERY BYPASS GRAFT     ESOPHAGOGASTRODUODENOSCOPY (EGD) WITH PROPOFOL N/A  01/08/2017   Procedure: ESOPHAGOGASTRODUODENOSCOPY (EGD) WITH PROPOFOL;  Surgeon: Scot Jun, MD;  Location: [facility] ENDOSCOPY;  Service: Endoscopy;  Laterality: N/A;   NECK SURGERY     discs   TUBAL LIGATION      FAMILY HISTORY: Family History  Problem Relation Age of Onset   Diabetes Mother    Hypertension Father    Clotting disorder Sister    Breast cancer Daughter 47   Breast cancer Daughter 52       CHEK2+    HEALTH MAINTENANCE: Social History   Tobacco Use   Smoking status: Former     Packs/day: 0.50    Years: 5.00    Additional pack years: 0.00    Total pack years: 2.50    Types: Cigarettes    Start date: 10/09/1973    Quit date: 10/09/1978    Years since quitting: 44.3   Smokeless tobacco: Never  Vaping Use   Vaping Use: Never used  Substance Use Topics   Alcohol use: No   Drug use: No     Allergies  Allergen Reactions   Codeine Itching and Other (See Comments)    Sensation of something crawling all over her   Nyquil Hbp Cold & Flu [Dm-Doxylamine-Acetaminophen] Other (See Comments)   Pravastatin Other (See Comments)    Muscle Pain   Seroquel [Quetiapine] Other (See Comments)    Current Outpatient Medications  Medication Sig Dispense Refill   ezetimibe (ZETIA) 10 MG tablet Take 10 mg by mouth in the morning.     furosemide (LASIX) 40 MG tablet Take 40 mg by mouth daily as needed (fluid retention.).     meloxicam (MOBIC) 15 MG tablet Take 15 mg by mouth daily.     potassium chloride (K-DUR,KLOR-CON) 10 MEQ tablet Take 10 mEq by mouth in the morning.     rosuvastatin (CRESTOR) 5 MG tablet Take 5 mg by mouth in the morning.     traZODone (DESYREL) 150 MG tablet Take 150 mg by mouth at bedtime.     albuterol (PROVENTIL HFA;VENTOLIN HFA) 108 (90 Base) MCG/ACT inhaler Inhale 1-2 puffs into the lungs every 6 (six) hours as needed for wheezing or shortness of breath.     apixaban (ELIQUIS) 5 MG TABS tablet Take 1 tablet (5 mg total) by mouth 2 (two) times daily. 60 tablet 5   budesonide-formoterol (SYMBICORT) 160-4.5 MCG/ACT inhaler Inhale 2 puffs into the lungs 2 (two) times daily as needed (wheezing/shortness of breath).     citalopram (CELEXA) 40 MG tablet Take 40 mg by mouth in the morning.     ENTRESTO 24-26 MG Take 700 tablets by mouth 2 (two) times daily.     erythromycin ophthalmic ointment Place into the left eye every 8 (eight) hours. (Patient not taking: Reported on 01/23/2023) 3.5 g 0   HYDROcodone-acetaminophen (NORCO) 5-325 MG tablet Take 1 tablet by  mouth every 6 (six) hours as needed for moderate pain or severe pain. (Patient not taking: Reported on 01/23/2023) 30 tablet 0   metoprolol succinate (TOPROL-XL) 50 MG 24 hr tablet Take 50 mg by mouth in the morning. Take with or immediately following a meal. (Patient not taking: Reported on 01/23/2023)     pregabalin (LYRICA) 150 MG capsule Take 150 mg by mouth 3 (three) times daily. (Patient not taking: Reported on 01/23/2023)     No current facility-administered medications for this visit.   Facility-Administered Medications Ordered in Other Visits  Medication Dose Route Frequency Provider Last Rate Last Admin  indocyanine green (IC-GREEN) injection 5 mg  5 mg Intravenous Once Lombard, Isami, DO        OBJECTIVE: Vitals:   01/23/23 1428  BP: (!) 112/57  Pulse: 60  Resp: 16  Temp: 99 F (37.2 C)  SpO2: 98%     Body mass index is 38.29 kg/m.      General: Well-developed, well-nourished, no acute distress. Eyes: Pink conjunctiva, anicteric sclera. HEENT: Normocephalic, moist mucous membranes, clear oropharnyx. Lungs: Clear to auscultation bilaterally. Heart: Regular rate and rhythm. No rubs, murmurs, or gallops. Abdomen: Soft, nontender, nondistended. No organomegaly noted, normoactive bowel sounds. Musculoskeletal: No edema, cyanosis, or clubbing. Neuro: Alert, answering all questions appropriately. Cranial nerves grossly intact. Skin: No rashes or petechiae noted. Psych: Normal affect. Lymphatics: No cervical, calvicular, axillary or inguinal LAD.   LAB RESULTS:  Lab Results  Component Value Date   NA 141 10/30/2022   K 4.1 10/30/2022   CL 108 10/30/2022   CO2 25 10/30/2022   GLUCOSE 98 10/30/2022   BUN 21 10/30/2022   CREATININE 1.20 (H) 10/30/2022   CALCIUM 8.6 (L) 10/30/2022   PROT 6.9 05/11/2021   ALBUMIN 3.3 (L) 05/11/2021   AST 26 05/11/2021   ALT 17 05/11/2021   ALKPHOS 76 05/11/2021   BILITOT 0.5 05/11/2021   GFRNONAA 48 (L) 10/30/2022   GFRAA 58 (L)  02/10/2020    Lab Results  Component Value Date   WBC 8.9 10/30/2022   NEUTROABS 5.7 10/30/2022   HGB 12.6 10/30/2022   HCT 38.9 10/30/2022   MCV 85.9 10/30/2022   PLT 139 (L) 10/30/2022    No results found for: "TIBC", "FERRITIN", "IRONPCTSAT"   STUDIES: MM 3D SCREENING MAMMOGRAM BILATERAL BREAST  Result Date: 01/15/2023 CLINICAL DATA:  Screening. EXAM: DIGITAL SCREENING BILATERAL MAMMOGRAM WITH TOMOSYNTHESIS AND CAD TECHNIQUE: Bilateral screening digital craniocaudal and mediolateral oblique mammograms were obtained. Bilateral screening digital breast tomosynthesis was performed. The images were evaluated with computer-aided detection. COMPARISON:  Previous exam(s). ACR Breast Density Category b: There are scattered areas of fibroglandular density. FINDINGS: There are no findings suspicious for malignancy. LEFT surgical changes again noted. IMPRESSION: No mammographic evidence of malignancy. A result letter of this screening mammogram will be mailed directly to the patient. RECOMMENDATION: Screening mammogram in one year. (Code:SM-B-01Y) BI-RADS CATEGORY  2: Benign. Electronically Signed   By: Harmon Pier M.D.   On: 01/15/2023 16:28    ASSESSMENT AND PLAN:   Ann Parker is a 72 y.o. female with pmh of left breast cancer s/p lumpectomy, chemo, RT diagnosed in 2000 in [place], robotic sigmoidectomy due to recurrent diverticulitis and unprovoked right leg DVT on Eliquis, CHF, hyperlipidemia, depression was referred to medical oncology to discuss about chemoprevention for high risk breast.  # Personal history of breast cancer # Strong family history of breast cancer # CHEK2 positive -Her younger daughter was recently diagnosed with breast cancer.  So she underwent genetic testing. 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  Patient has personal history, strong family history of breast cancer and also tested positive for CHEK2 which increases the  lifetime risk of having breast cancer by 20 to 40%.  There is no evidence on prophylactic mastectomy with CHEK2 mutation.  I discussed about data on aromatase inhibitor such as anastrozole and exemestane versus tamoxifen for 5 years were discussed for chemoprevention.  It reduces the risk of invasive breast cancer by about 50% for hormone positive breast cancer but no difference noted in overall survival at this  time.  Side effects of AI such as hot flashes, night sweats, myalgia, arthralgia, decrease in bone density was discussed.  Monitoring of bone health with DEXA scan every 2 years.  Side effects of tamoxifen such as hot flashes, increased risk of blood clot and a small risk of endometrial cancer was discussed.   References discussed-  Anastrozole for prevention of breast cancer in high-risk postmenopausal women (IBIS-II): an international, double-blind, randomised placebo-controlled trial. 1920 women were randomly assigned to receive anastrozole and 1944 to placebo. After a median follow-up of 50 years (IQR 30-71), 40 women in the anastrozole group (2%) and 85 in the placebo group (4%) had developed breast cancer (hazard ratio 047, 95% CI 032-068, p<00001). The predicted cumulative incidence of all breast cancers after 7 years was 56% in the placebo group and 28% in the anastrozole group. 18 deaths were reported in the anastrozole group and 17 in the placebo group, and no specific causes were more common in one group than the other (p=0836).    Use of anastrozole for breast cancer prevention (IBIS-II): long-term results of a randomised controlled trial. Lancet. 2020;395(10218):117. Epub 2019 Dec 12. 3864 women were recruited between Nov 10, 2001, and Nov 08, 2010. 1920 women were randomly assigned to 5 years anastrozole and 1944 to placebo. After a median follow-up of 131 months (IQR 105-156), a 49% reduction in breast cancer was observed for anastrozole (85 vs 165 cases, hazard ratio [HR]051,  95% CI 039-066, p<00001). The reduction was larger in the first 5 years (35 vs 89, 039, 027-058, p<00001), but still significant after 5 years (50 vs 76 new cases, 064, 045-091, p=0014), and not significantly different from the first 5 years (p=0087). Invasive oestrogen receptor-positive breast cancer was reduced by 54% (HR 046, 95% CI 033-065, p<00001), with a continued significant effect in the period after treatment. A 59% reduction in ductal carcinoma in situ was observed (041, 022-079, p=00081), especially in participants known to be oestrogen receptor-positive (022, 078-065, p<00001). No significant difference in deaths was observed overall (69 vs 70, HR 096, 95% CI 069-134, p=082) or for breast cancer (two anastrozole vs three placebo).    -Patient would like to proceed.  I sent a prescription for anastrozole 1 mg daily.  I will also obtain DEXA scan for baseline bone health.  Advised to start calcium and vitamin D supplements.  I would avoid tamoxifen in her with her prior history of DVTs.  -Labs reviewed from February 26 and was unremarkable.  - continue with high risk screening as advised by Genetics. Screening mammogram with tomosynthesis on 4/5 was normal. Repeat in 1 year. Can consider alternating with breast mri.  Orders Placed This Encounter  Procedures   DG Bone Density   RTC in 6 weeks for MD visit to assess toxicity check for anastrozole.  Patient expressed understanding and was in agreement with this plan. She also understands that She can call clinic at any time with any questions, concerns, or complaints.   I spent a total of 45 minutes reviewing chart data, face-to-face evaluation with the patient, counseling and coordination of care as detailed above.  Michaelyn Barter, MD   01/23/2023 2:45 PM

## 2023-01-29 DIAGNOSIS — Z79811 Long term (current) use of aromatase inhibitors: Secondary | ICD-10-CM | POA: Insufficient documentation

## 2023-01-29 DIAGNOSIS — Z2989 Encounter for other specified prophylactic measures: Secondary | ICD-10-CM | POA: Insufficient documentation

## 2023-01-29 DIAGNOSIS — Z803 Family history of malignant neoplasm of breast: Secondary | ICD-10-CM | POA: Insufficient documentation

## 2023-03-06 ENCOUNTER — Ambulatory Visit: Payer: Medicare HMO | Admitting: Internal Medicine

## 2023-03-08 ENCOUNTER — Other Ambulatory Visit (INDEPENDENT_AMBULATORY_CARE_PROVIDER_SITE_OTHER): Payer: Self-pay | Admitting: Vascular Surgery

## 2023-03-08 DIAGNOSIS — I82431 Acute embolism and thrombosis of right popliteal vein: Secondary | ICD-10-CM

## 2023-03-11 NOTE — Progress Notes (Unsigned)
MRN : [id number]  Ann Parker is a 72 y.o. ([birth year]) female who presents with chief complaint of legs hurt and swell.  History of Present Illness:   The patient presents to the office for follow up regarding DVT.  DVT was identified at the [facility] ER on 10/20/2022 by Duplex ultrasound.  The initial symptoms were pain and swelling in the lower extremity.  Duplex ultrasound is reviewed by me and demonstrates acute occlusive thrombus in the right popliteal.   The patient notes the affected leg is improved.  She does not have a large amount of swelling.  Although she gets a little bit of pain behind her knee this is more of an occasional thing frequently related to sitting in a chair and sounds more orthopedic than postphlebitic.  The patient has not been using compression therapy at this point.  The patient is on anticoagulation.  No SOB or pleuritic chest pains.  No cough or hemoptysis.  No blood per rectum or blood in any sputum.  No excessive bruising per the patient.   No recent shortening of the patient's walking distance or new symptoms consistent with claudication.  No history of rest pain symptoms. No new ulcers or wounds of the lower extremities have occurred.  Duplex ultrasound of the right lower extremity venous system obtained today demonstrates complete resolution of the previously noted popliteal DVT.  No evidence of chronic changes.  No outpatient medications have been marked as taking for the 03/12/23 encounter (Appointment) with Gilda Crease, Latina Craver, MD.    Past Medical History:  Diagnosis Date   Anxiety    Bilateral anterior knee pain    Breast cancer (HCC) 2000   left lumpectomy   CHF (congestive heart failure) (HCC)    CHF (congestive heart failure) (HCC)    Chronic insomnia    Depression    Diverticulitis    Dysphagia    Dyspnea    Fibromyalgia    GERD (gastroesophageal reflux disease)    Hyperlipidemia    Hypothyroidism    Obesity, Class III, BMI  40-49.9 (morbid obesity) (HCC)    Pericardial effusion    Personal history of chemotherapy 2000   left breast cancer   Personal history of radiation therapy 2000   left breast cancer   Precordial pain    SOB (shortness of breath)    Urinary incontinence in female     Past Surgical History:  Procedure Laterality Date   BREAST BIOPSY Left 2000   Positive   BREAST LUMPECTOMY Left 2000   BREAST LUMPECTOMY WITH AXILLARY LYMPH NODE DISSECTION Left 2000   BREAST SURGERY     CHOLECYSTECTOMY     COLON SURGERY     COLONOSCOPY WITH PROPOFOL N/A 08/12/2021   Procedure: COLONOSCOPY WITH PROPOFOL;  Surgeon: Regis Bill, MD;  Location: [facility] ENDOSCOPY;  Service: Endoscopy;  Laterality: N/A;   COLONOSCOPY WITH PROPOFOL N/A 06/22/2022   Procedure: COLONOSCOPY WITH PROPOFOL;  Surgeon: Sung Amabile, DO;  Location: [facility] ENDOSCOPY;  Service: General;  Laterality: N/A;   CORONARY ARTERY BYPASS GRAFT     ESOPHAGOGASTRODUODENOSCOPY (EGD) WITH PROPOFOL N/A 01/08/2017   Procedure: ESOPHAGOGASTRODUODENOSCOPY (EGD) WITH PROPOFOL;  Surgeon: Scot Jun, MD;  Location: [facility] ENDOSCOPY;  Service: Endoscopy;  Laterality: N/A;   NECK SURGERY     discs   TUBAL LIGATION      Social History Social History   Tobacco Use   Smoking status: Former    Packs/day: 0.50  Years: 5.00    Additional pack years: 0.00    Total pack years: 2.50    Types: Cigarettes    Start date: 10/09/1973    Quit date: 10/09/1978    Years since quitting: 44.4   Smokeless tobacco: Never  Vaping Use   Vaping Use: Never used  Substance Use Topics   Alcohol use: No   Drug use: No    Family History Family History  Problem Relation Age of Onset   Diabetes Mother    Hypertension Father    Clotting disorder Sister    Breast cancer Daughter 82   Breast cancer Daughter 53       CHEK2+    Allergies  Allergen Reactions   Codeine Itching and Other (See Comments)    Sensation of something crawling all over her   Nyquil  Hbp Cold & Flu [Dm-Doxylamine-Acetaminophen] Other (See Comments)   Pravastatin Other (See Comments)    Muscle Pain   Seroquel [Quetiapine] Other (See Comments)     REVIEW OF SYSTEMS (Negative unless checked)  Constitutional: [] Weight loss  [] Fever  [] Chills Cardiac: [] Chest pain   [] Chest pressure   [] Palpitations   [] Shortness of breath when laying flat   [] Shortness of breath with exertion. Vascular:  [] Pain in legs with walking   [x] Pain in legs at rest  [] History of DVT   [] Phlebitis   [x] Swelling in legs   [] Varicose veins   [] Non-healing ulcers Pulmonary:   [] Uses home oxygen   [] Productive cough   [] Hemoptysis   [] Wheeze  [] COPD   [] Asthma Neurologic:  [] Dizziness   [] Seizures   [] History of stroke   [] History of TIA  [] Aphasia   [] Vissual changes   [] Weakness or numbness in arm   [] Weakness or numbness in leg Musculoskeletal:   [] Joint swelling   [] Joint pain   [] Low back pain Hematologic:  [] Easy bruising  [] Easy bleeding   [] Hypercoagulable state   [] Anemic Gastrointestinal:  [] Diarrhea   [] Vomiting  [x] Gastroesophageal reflux/heartburn   [] Difficulty swallowing. Genitourinary:  [] Chronic kidney disease   [] Difficult urination  [] Frequent urination   [] Blood in urine Skin:  [] Rashes   [] Ulcers  Psychological:  [x] History of anxiety   [x]  History of major depression.  Physical Examination  There were no vitals filed for this visit. There is no height or weight on file to calculate BMI. Gen: WD/WN, NAD Head: [place]/AT, No temporalis wasting.  Ear/Nose/Throat: Hearing grossly intact, nares w/o erythema or drainage, pinna without lesions Eyes: PER, EOMI, sclera nonicteric.  Neck: Supple, no gross masses.  No JVD.  Pulmonary:  Good air movement, no audible wheezing, no use of accessory muscles.  Cardiac: RRR, precordium not hyperdynamic. Vascular:  scattered varicosities present bilaterally.  Mild venous stasis changes to the legs bilaterally.  Trace soft pitting edema. CEAP  C4sEpAsPr   Vessel Right Left  Radial Palpable Palpable  Gastrointestinal: soft, non-distended. No guarding/no peritoneal signs.  Musculoskeletal: M/S 5/5 throughout.  No deformity.  Neurologic: CN 2-12 intact. Pain and light touch intact in extremities.  Symmetrical.  Speech is fluent. Motor exam as listed above. Psychiatric: Judgment intact, Mood & affect appropriate for pt's clinical situation. Dermatologic: Venous rashes no ulcers noted.  No changes consistent with cellulitis. Lymph : No lichenification or skin changes of chronic lymphedema.  CBC Lab Results  Component Value Date   WBC 8.9 10/30/2022   HGB 12.6 10/30/2022   HCT 38.9 10/30/2022   MCV 85.9 10/30/2022   PLT 139 (L) 10/30/2022  BMET    Component Value Date/Time   NA 141 10/30/2022 1418   K 4.1 10/30/2022 1418   CL 108 10/30/2022 1418   CO2 25 10/30/2022 1418   GLUCOSE 98 10/30/2022 1418   BUN 21 10/30/2022 1418   CREATININE 1.20 (H) 10/30/2022 1418   CALCIUM 8.6 (L) 10/30/2022 1418   GFRNONAA 48 (L) 10/30/2022 1418   GFRAA 58 (L) 02/10/2020 1705   CrCl cannot be calculated (Patient's most recent lab result is older than the maximum 21 days allowed.).  COAG No results found for: "INR", "PROTIME"  Radiology No results found.   Assessment/Plan 1. Acute deep vein thrombosis (DVT) of popliteal vein of right lower extremity (HCC) Recommend:   No surgery or intervention at this point in time.  IVC filter is not indicated at present.  Patient's duplex ultrasound of the venous system shows DVT from the popliteal to the femoral veins.  The patient is on anticoagulation   Elevation was stressed, use of a recliner was discussed.  I have had a long discussion with the patient regarding DVT and post phlebitic changes such as swelling and why it  causes symptoms such as pain.  The patient will wear graduated compression stockings class 1 (20-30 mmHg), beginning after three full days of anticoagulation, on a  daily basis a prescription was given. The patient will  beginning wearing the stockings first thing in the morning and removing them in the evening. The patient is instructed specifically not to sleep in the stockings.  In addition, behavioral modification including elevation during the day and avoidance of prolonged dependency will be initiated.    The patient will continue anticoagulation for now as there have not been any problems or complications from his anticoagulation therapy at this point.   - Hypercoagulable panel, comprehensive; Future  2. Gastroesophageal reflux disease without esophagitis Continue PPI as already ordered, this medication has been reviewed and there are no changes at this time.  Avoidence of caffeine and alcohol  Moderate elevation of the head of the bed   3. Mixed hyperlipidemia Continue statin as ordered and reviewed, no changes at this time    Levora Dredge, MD  03/11/2023 2:27 PM

## 2023-03-12 ENCOUNTER — Encounter (INDEPENDENT_AMBULATORY_CARE_PROVIDER_SITE_OTHER): Payer: Self-pay | Admitting: Vascular Surgery

## 2023-03-12 ENCOUNTER — Ambulatory Visit (INDEPENDENT_AMBULATORY_CARE_PROVIDER_SITE_OTHER): Payer: Medicare HMO

## 2023-03-12 ENCOUNTER — Ambulatory Visit (INDEPENDENT_AMBULATORY_CARE_PROVIDER_SITE_OTHER): Payer: Medicare HMO | Admitting: Vascular Surgery

## 2023-03-12 VITALS — BP 114/72 | HR 63 | Resp 16 | Wt 241.2 lb

## 2023-03-12 DIAGNOSIS — E782 Mixed hyperlipidemia: Secondary | ICD-10-CM | POA: Diagnosis not present

## 2023-03-12 DIAGNOSIS — K219 Gastro-esophageal reflux disease without esophagitis: Secondary | ICD-10-CM

## 2023-03-12 DIAGNOSIS — I82431 Acute embolism and thrombosis of right popliteal vein: Secondary | ICD-10-CM

## 2023-03-12 MED ORDER — APIXABAN 2.5 MG PO TABS: 2.5000 mg | ORAL_TABLET | Freq: Two times a day (BID) | ORAL | 6 refills | Status: AC

## 2023-03-28 ENCOUNTER — Other Ambulatory Visit: Payer: Medicare HMO

## 2023-03-30 ENCOUNTER — Inpatient Hospital Stay: Payer: Medicare HMO | Attending: Oncology | Admitting: Internal Medicine

## 2023-07-24 ENCOUNTER — Telehealth (INDEPENDENT_AMBULATORY_CARE_PROVIDER_SITE_OTHER): Payer: Self-pay

## 2023-07-24 NOTE — Telephone Encounter (Signed)
Janene from [facility] left a message stating that the patient is not able to afford Eliquis 2.5mg  and was seeing if there can be alternative. She did informed patient to contact the office also.

## 2023-07-24 NOTE — Telephone Encounter (Signed)
I left a detail message on patient voicemail.

## 2023-07-24 NOTE — Telephone Encounter (Signed)
Because she her last ultrasound showed resolution of her DVT and she has completed over 6 months of anticoagulation, she can transition to a baby aspirin daily

## 2023-09-23 NOTE — Progress Notes (Unsigned)
MRN : [id number]  Ann Parker is a 72 y.o. (11-09-1950) female who presents with chief complaint of legs hurt and swell.  History of Present Illness:   The patient presents to the office for follow up regarding DVT.  DVT was identified at the [facility] ER on 10/20/2022 by Duplex ultrasound.  The initial symptoms were pain and swelling in the lower extremity.  Duplex ultrasound is reviewed by me and demonstrates acute occlusive thrombus in the right popliteal.    The patient notes the affected leg is improved.  She does not have a large amount of swelling.  Although she gets a little bit of pain behind her knee this is more of an occasional thing frequently related to sitting in a chair and sounds more orthopedic than postphlebitic.   The patient has not been using compression therapy at this point.   The patient is on anticoagulation.   No SOB or pleuritic chest pains.  No cough or hemoptysis.   No blood per rectum or blood in any sputum.  No excessive bruising per the patient.    No recent shortening of the patient's walking distance or new symptoms consistent with claudication.  No history of rest pain symptoms. No new ulcers or wounds of the lower extremities have occurred.   Duplex ultrasound of the right lower extremity venous system obtained today demonstrates complete resolution of the previously noted popliteal DVT.  No evidence of chronic changes.  No outpatient medications have been marked as taking for the 09/24/23 encounter (Appointment) with Gilda Crease, Latina Craver, MD.    Past Medical History:  Diagnosis Date   Anxiety    Bilateral anterior knee pain    Breast cancer (HCC) 2000   left lumpectomy   CHF (congestive heart failure) (HCC)    CHF (congestive heart failure) (HCC)    Chronic insomnia    Depression    Diverticulitis    Dysphagia    Dyspnea    Fibromyalgia    GERD (gastroesophageal reflux disease)    Hyperlipidemia    Hypothyroidism    Obesity, Class  III, BMI 40-49.9 (morbid obesity) (HCC)    Pericardial effusion    Personal history of chemotherapy 2000   left breast cancer   Personal history of radiation therapy 2000   left breast cancer   Precordial pain    SOB (shortness of breath)    Urinary incontinence in female     Past Surgical History:  Procedure Laterality Date   BREAST BIOPSY Left 2000   Positive   BREAST LUMPECTOMY Left 2000   BREAST LUMPECTOMY WITH AXILLARY LYMPH NODE DISSECTION Left 2000   BREAST SURGERY     CHOLECYSTECTOMY     COLON SURGERY     COLONOSCOPY WITH PROPOFOL N/A 08/12/2021   Procedure: COLONOSCOPY WITH PROPOFOL;  Surgeon: Regis Bill, MD;  Location: [facility] ENDOSCOPY;  Service: Endoscopy;  Laterality: N/A;   COLONOSCOPY WITH PROPOFOL N/A 06/22/2022   Procedure: COLONOSCOPY WITH PROPOFOL;  Surgeon: Sung Amabile, DO;  Location: [facility] ENDOSCOPY;  Service: General;  Laterality: N/A;   CORONARY ARTERY BYPASS GRAFT     ESOPHAGOGASTRODUODENOSCOPY (EGD) WITH PROPOFOL N/A 01/08/2017   Procedure: ESOPHAGOGASTRODUODENOSCOPY (EGD) WITH PROPOFOL;  Surgeon: Scot Jun, MD;  Location[facility] ENDOSCOPY;  Service: Endoscopy;  Laterality: N/A;   NECK SURGERY     discs   TUBAL LIGATION      Social History Social History   Tobacco Use   Smoking  status: Former    Current packs/day: 0.00    Average packs/day: 0.5 packs/day for 5.0 years (2.5 ttl pk-yrs)    Types: Cigarettes    Start date: 10/09/1973    Quit date: 10/09/1978    Years since quitting: 44.9   Smokeless tobacco: Never  Vaping Use   Vaping status: Never Used  Substance Use Topics   Alcohol use: No   Drug use: No    Family History Family History  Problem Relation Age of Onset   Diabetes Mother    Hypertension Father    Clotting disorder Sister    Breast cancer Daughter 20   Breast cancer Daughter 45       CHEK2+    Allergies  Allergen Reactions   Codeine Itching and Other (See Comments)    Sensation of something crawling all over  her   Nyquil Hbp Cold & Flu [Dm-Doxylamine-Acetaminophen] Other (See Comments)   Pravastatin Other (See Comments)    Muscle Pain   Seroquel [Quetiapine] Other (See Comments)     REVIEW OF SYSTEMS (Negative unless checked)  Constitutional: [] Weight loss  [] Fever  [] Chills Cardiac: [] Chest pain   [] Chest pressure   [] Palpitations   [] Shortness of breath when laying flat   [] Shortness of breath with exertion. Vascular:  [] Pain in legs with walking   [x] Pain in legs at rest  [] History of DVT   [] Phlebitis   [x] Swelling in legs   [] Varicose veins   [] Non-healing ulcers Pulmonary:   [] Uses home oxygen   [] Productive cough   [] Hemoptysis   [] Wheeze  [] COPD   [] Asthma Neurologic:  [] Dizziness   [] Seizures   [] History of stroke   [] History of TIA  [] Aphasia   [] Vissual changes   [] Weakness or numbness in arm   [] Weakness or numbness in leg Musculoskeletal:   [] Joint swelling   [] Joint pain   [] Low back pain Hematologic:  [] Easy bruising  [] Easy bleeding   [] Hypercoagulable state   [] Anemic Gastrointestinal:  [] Diarrhea   [] Vomiting  [] Gastroesophageal reflux/heartburn   [] Difficulty swallowing. Genitourinary:  [] Chronic kidney disease   [] Difficult urination  [] Frequent urination   [] Blood in urine Skin:  [] Rashes   [] Ulcers  Psychological:  [] History of anxiety   []  History of major depression.  Physical Examination  There were no vitals filed for this visit. There is no height or weight on file to calculate BMI. Gen: WD/WN, NAD Head: [place]/AT, No temporalis wasting.  Ear/Nose/Throat: Hearing grossly intact, nares w/o erythema or drainage, pinna without lesions Eyes: PER, EOMI, sclera nonicteric.  Neck: Supple, no gross masses.  No JVD.  Pulmonary:  Good air movement, no audible wheezing, no use of accessory muscles.  Cardiac: RRR, precordium not hyperdynamic. Vascular:  scattered varicosities present bilaterally.  Moderate venous stasis changes to the legs bilaterally.  2+ soft pitting edema.  CEAP C4sEpAsPr   Vessel Right Left  Radial Palpable Palpable  Gastrointestinal: soft, non-distended. No guarding/no peritoneal signs.  Musculoskeletal: M/S 5/5 throughout.  No deformity.  Neurologic: CN 2-12 intact. Pain and light touch intact in extremities.  Symmetrical.  Speech is fluent. Motor exam as listed above. Psychiatric: Judgment intact, Mood & affect appropriate for pt's clinical situation. Dermatologic: Venous rashes no ulcers noted.  No changes consistent with cellulitis. Lymph : No lichenification or skin changes of chronic lymphedema.  CBC Lab Results  Component Value Date   WBC 8.9 10/30/2022   HGB 12.6 10/30/2022   HCT 38.9 10/30/2022   MCV 85.9 10/30/2022   PLT  139 (L) 10/30/2022    BMET    Component Value Date/Time   NA 141 10/30/2022 1418   K 4.1 10/30/2022 1418   CL 108 10/30/2022 1418   CO2 25 10/30/2022 1418   GLUCOSE 98 10/30/2022 1418   BUN 21 10/30/2022 1418   CREATININE 1.20 (H) 10/30/2022 1418   CALCIUM 8.6 (L) 10/30/2022 1418   GFRNONAA 48 (L) 10/30/2022 1418   GFRAA 58 (L) 02/10/2020 1705   CrCl cannot be calculated (Patient's most recent lab result is older than the maximum 21 days allowed.).  COAG No results found for: "INR", "PROTIME"  Radiology No results found.   Assessment/Plan There are no diagnoses linked to this encounter.   Levora Dredge, MD  09/23/2023 11:03 AM

## 2023-09-24 ENCOUNTER — Ambulatory Visit (INDEPENDENT_AMBULATORY_CARE_PROVIDER_SITE_OTHER): Payer: Medicare HMO | Admitting: Vascular Surgery

## 2023-09-24 DIAGNOSIS — E782 Mixed hyperlipidemia: Secondary | ICD-10-CM

## 2023-09-24 DIAGNOSIS — K219 Gastro-esophageal reflux disease without esophagitis: Secondary | ICD-10-CM

## 2023-09-24 DIAGNOSIS — I82431 Acute embolism and thrombosis of right popliteal vein: Secondary | ICD-10-CM

## 2023-09-24 DIAGNOSIS — E039 Hypothyroidism, unspecified: Secondary | ICD-10-CM

## 2023-11-08 ENCOUNTER — Ambulatory Visit
Admission: EM | Admit: 2023-11-08 | Discharge: 2023-11-08 | Disposition: A | Discharge status: Home / Self Care | Payer: Medicare HMO | Attending: Family Medicine | Admitting: Family Medicine

## 2023-11-08 DIAGNOSIS — J069 Acute upper respiratory infection, unspecified: Secondary | ICD-10-CM | POA: Insufficient documentation

## 2023-11-08 LAB — SARS CORONAVIRUS 2 BY RT PCR: SARS Coronavirus 2 by RT PCR: NEGATIVE

## 2023-11-08 LAB — RAPID INFLUENZA A&B ANTIGENS
Influenza A (ARMC): NEGATIVE
Influenza B (ARMC): NEGATIVE

## 2023-11-08 NOTE — Discharge Instructions (Addendum)
Your COVID and flu tests are negative. Your symptoms will gradually improve over the next 7 to 10 days.  The cough may last about 3 weeks.   Take Tylenol  for fever, headache or body aches.   For cough: You can also use guaifenesin and dextromethorphan for cough. You can use a humidifier for chest congestion and cough.  If you don't have a humidifier, you can sit in the bathroom with the hot shower running.      For sore throat: try warm salt water gargles, Mucinex sore throat cough drops or cepacol lozenges, throat spray, warm tea or water with lemon/honey, popsicles or ice, or OTC cold relief medicine for throat discomfort. You can also purchase chloraseptic spray at the pharmacy or dollar store.   For congestion: take a daily anti-histamine like Zyrtec, Claritin, and a oral decongestant, such as pseudoephedrine.  You can also use Flonase 1-2 sprays in each nostril daily. Afrin is also a good option, if you do not have high blood pressure.    It is important to stay hydrated: drink plenty of fluids (water, gatorade/powerade/pedialyte, juices, or teas) to keep your throat moisturized and help further relieve irritation/discomfort.    Return or go to the Emergency Department if symptoms worsen or do not improve in the next few days

## 2023-11-08 NOTE — ED Triage Notes (Signed)
Pt c/o cough and body aches x 3 days

## 2023-11-08 NOTE — ED Provider Notes (Signed)
[facility]    CSN: [id number] Arrival date & time: 11/08/23  0904      History   Chief Complaint Chief Complaint  Patient presents with   Cough   Generalized Body Aches    HPI PHYLLICIA DUDEK is a 73 y.o. female.   HPI  History obtained from the patient. Chellie presents for cough, rhinorrhea, body aches, sore throat and chills that started 2 days ago.  Took some Tylenol.  She felt warm but didn't check her temperature.  She had had some diarrhea but no vomiting.    Uses Symbicort for asthma.      Past Medical History:  Diagnosis Date   Anxiety    Bilateral anterior knee pain    Breast cancer (HCC) 2000   left lumpectomy   CHF (congestive heart failure) (HCC)    CHF (congestive heart failure) (HCC)    Chronic insomnia    Depression    Diverticulitis    Dysphagia    Dyspnea    Fibromyalgia    GERD (gastroesophageal reflux disease)    Hyperlipidemia    Hypothyroidism    Obesity, Class III, BMI 40-49.9 (morbid obesity) (HCC)    Pericardial effusion    Personal history of chemotherapy 2000   left breast cancer   Personal history of radiation therapy 2000   left breast cancer   Precordial pain    SOB (shortness of breath)    Urinary incontinence in female     Patient Active Problem List   Diagnosis Date Noted   Chemoprevention 01/29/2023   Long term current use of aromatase inhibitor 01/29/2023   Family history of breast cancer 01/29/2023   CHEK2 positive 01/22/2023   Genetic testing 01/22/2023   DVT (deep venous thrombosis) (HCC) 11/06/2022   Diverticulitis large intestine 08/25/2021   Chronic insomnia 12/16/2016   Bilateral anterior knee pain 08/22/2016   Depression, major, single episode, complete remission (HCC) 06/15/2016   Dysphagia, unspecified 06/15/2016   Urinary incontinence in female 06/15/2016   Body mass index (BMI) of 40.1-44.9 in adult (HCC) 12/17/2015   Chronic systolic CHF (congestive heart failure), NYHA class 3 (HCC)  08/11/2015   Mixed hyperlipidemia 08/11/2015   Pericardial effusion 08/11/2015   Acquired hypothyroidism 08/03/2015   Anxiety 08/03/2015   Fibromyalgia 08/03/2015   Gastroesophageal reflux disease without esophagitis 08/03/2015    Past Surgical History:  Procedure Laterality Date   BREAST BIOPSY Left 2000   Positive   BREAST LUMPECTOMY Left 2000   BREAST LUMPECTOMY WITH AXILLARY LYMPH NODE DISSECTION Left 2000   BREAST SURGERY     CHOLECYSTECTOMY     COLON SURGERY     COLONOSCOPY WITH PROPOFOL N/A 08/12/2021   Procedure: COLONOSCOPY WITH PROPOFOL;  Surgeon: Regis Bill, MD;  Location: [facility] ENDOSCOPY;  Service: Endoscopy;  Laterality: N/A;   COLONOSCOPY WITH PROPOFOL N/A 06/22/2022   Procedure: COLONOSCOPY WITH PROPOFOL;  Surgeon: Sung Amabile, DO;  Location: [facility] ENDOSCOPY;  Service: General;  Laterality: N/A;   CORONARY ARTERY BYPASS GRAFT     ESOPHAGOGASTRODUODENOSCOPY (EGD) WITH PROPOFOL N/A 01/08/2017   Procedure: ESOPHAGOGASTRODUODENOSCOPY (EGD) WITH PROPOFOL;  Surgeon: Scot Jun, MD;  Location: [facility] ENDOSCOPY;  Service: Endoscopy;  Laterality: N/A;   NECK SURGERY     discs   TUBAL LIGATION      OB History   No obstetric history on file.      Home Medications    Prior to Admission medications   Medication Sig Start Date End Date  Taking? Authorizing Provider  albuterol (PROVENTIL HFA;VENTOLIN HFA) 108 (90 Base) MCG/ACT inhaler Inhale 1-2 puffs into the lungs every 6 (six) hours as needed for wheezing or shortness of breath.   Yes [provider]  apixaban (ELIQUIS) 2.5 MG TABS tablet Take 1 tablet (2.5 mg total) by mouth 2 (two) times daily. 03/12/23   Schnier, Latina Craver, MD  budesonide-formoterol (SYMBICORT) 160-4.5 MCG/ACT inhaler Inhale 2 puffs into the lungs 2 (two) times daily as needed (wheezing/shortness of breath).   Yes [provider]  citalopram (CELEXA) 40 MG tablet Take 40 mg by mouth in the morning. 05/30/21  Yes [provider]  ENTRESTO 24-26 MG Take 700 tablets by mouth 2 (two) times daily. 01/26/20  Yes [provider]  erythromycin ophthalmic ointment Place into the left eye every 8 (eight) hours. Patient not taking: Reported on 01/23/2023 08/29/21   Sung Amabile, DO  ezetimibe (ZETIA) 10 MG tablet Take 10 mg by mouth in the morning. 01/27/20  Yes [provider]  furosemide (LASIX) 40 MG tablet Take 40 mg by mouth daily as needed (fluid retention.).   Yes [provider]  HYDROcodone-acetaminophen (NORCO) 5-325 MG tablet Take 1 tablet by mouth every 6 (six) hours as needed for moderate pain or severe pain. 11/06/22  Yes Schnier, Latina Craver, MD  meloxicam (MOBIC) 15 MG tablet Take 15 mg by mouth daily. 11/18/22  Yes [provider]  metoprolol succinate (TOPROL-XL) 50 MG 24 hr tablet Take 50 mg by mouth in the morning. Take with or immediately following a meal.   Yes [provider]  potassium chloride (K-DUR,KLOR-CON) 10 MEQ tablet Take 10 mEq by mouth in the morning.   Yes [provider]  pregabalin (LYRICA) 150 MG capsule Take 150 mg by mouth 3 (three) times daily.   Yes [provider]  rosuvastatin (CRESTOR) 5 MG tablet Take 5 mg by mouth in the morning. 05/25/21  Yes [provider]  traZODone (DESYREL) 150 MG tablet Take 150 mg by mouth at bedtime. 06/08/21  Yes [provider]    Family History Family History  Problem Relation Age of Onset   Diabetes Mother    Hypertension Father    Clotting disorder Sister    Breast cancer Daughter 45   Breast cancer Daughter 20       CHEK2+    Social History Social History   Tobacco Use   Smoking status: Former    Current packs/day: 0.00    Average packs/day: 0.5 packs/day for 5.0 years (2.5 ttl pk-yrs)    Types: Cigarettes    Start date: 10/09/1973    Quit date: 10/09/1978    Years since quitting: 45.1   Smokeless tobacco: Never  Vaping Use   Vaping status: Never Used   Substance Use Topics   Alcohol use: No   Drug use: No     Allergies   Codeine, Nyquil hbp cold & flu [dm-doxylamine-acetaminophen], Pravastatin, and Seroquel [quetiapine]   Review of Systems Review of Systems: negative unless otherwise stated in HPI.      Physical Exam Triage Vital Signs ED Triage Vitals  Encounter Vitals Group     BP 11/08/23 1034 110/71     Systolic BP Percentile --      Diastolic BP Percentile --      Pulse Rate 11/08/23 1034 64     Resp --      Temp 11/08/23 1034 97.7 F (36.5 C)     Temp Source  11/08/23 1034 Oral     SpO2 11/08/23 1034 99 %     Weight 11/08/23 1032 240 lb (108.9 kg)     Height 11/08/23 1032 5\' 7"  (1.702 m)     Head Circumference --      Peak Flow --      Pain Score 11/08/23 1032 7     Pain Loc --      Pain Education --      Exclude from Growth Chart --    No data found.  Updated Vital Signs BP 110/71 (BP Location: Right Arm)   Pulse 64   Temp 97.7 F (36.5 C) (Oral)   Ht 5\' 7"  (1.702 m)   Wt 108.9 kg   SpO2 99%   BMI 37.59 kg/m   Visual Acuity Right Eye Distance:   Left Eye Distance:   Bilateral Distance:    Right Eye Near:   Left Eye Near:    Bilateral Near:     Physical Exam GEN:     alert, non-toxic appearing female in no distress    HENT:  mucus membranes moist, oropharyngeal without lesions or erythema, no tonsillar hypertrophy or exudates, clear nasal discharge, bilateral TM normal EYES:   no scleral injection or discharge  RESP:  no increased work of breathing, clear to auscultation bilaterally CVS:   regular rate and rhythm Skin:   warm and dry, no rash on visible skin    UC Treatments / Results  Labs (all labs ordered are listed, but only abnormal results are displayed) Labs Reviewed  SARS CORONAVIRUS 2 BY RT PCR  RAPID INFLUENZA A&B ANTIGENS    EKG   Radiology No results found.  Procedures Procedures (including critical care time)  Medications Ordered in UC Medications - No data  to display  Initial Impression / Assessment and Plan / UC Course  I have reviewed the triage vital signs and the nursing notes.  Pertinent labs & imaging results that were available during my care of the patient were reviewed by me and considered in my medical decision making (see chart for details).       Pt is a 73 y.o. female who presents for 3 days of respiratory symptoms. Iveth is afebrile here. Satting well on room air. Overall pt is non-toxic appearing, well hydrated, without respiratory distress. Pulmonary exam is unremarkable.  COVID obtained and was negative. Influenza antigen test was negative.    History most consistent with viral respiratory illness. Discussed symptomatic treatment.  Explained lack of efficacy of antibiotics in viral disease.  Typical duration of symptoms discussed.   Return and ED precautions given and voiced understanding. Discussed MDM, treatment plan and plan for follow-up with patient who agrees with plan.     Final Clinical Impressions(s) / UC Diagnoses   Final diagnoses:  Viral URI with cough     Discharge Instructions      Your COVID and flu tests are negative. Your symptoms will gradually improve over the next 7 to 10 days.  The cough may last about 3 weeks.   Take Tylenol  for fever, headache or body aches.   For cough: You can also use guaifenesin and dextromethorphan for cough. You can use a humidifier for chest congestion and cough.  If you don't have a humidifier, you can sit in the bathroom with the hot shower running.      For sore throat: try warm salt water gargles, Mucinex sore throat cough drops or cepacol lozenges, throat spray, warm  tea or water with lemon/honey, popsicles or ice, or OTC cold relief medicine for throat discomfort. You can also purchase chloraseptic spray at the pharmacy or dollar store.   For congestion: take a daily anti-histamine like Zyrtec, Claritin, and a oral decongestant, such as pseudoephedrine.  You  can also use Flonase 1-2 sprays in each nostril daily. Afrin is also a good option, if you do not have high blood pressure.    It is important to stay hydrated: drink plenty of fluids (water, gatorade/powerade/pedialyte, juices, or teas) to keep your throat moisturized and help further relieve irritation/discomfort.    Return or go to the Emergency Department if symptoms worsen or do not improve in the next few days      ED Prescriptions   None    PDMP not reviewed this encounter.   Katha Cabal, DO 11/08/23 1347

## 2023-12-04 DIAGNOSIS — I2510 Atherosclerotic heart disease of native coronary artery without angina pectoris: Secondary | ICD-10-CM | POA: Diagnosis not present

## 2023-12-04 DIAGNOSIS — E782 Mixed hyperlipidemia: Secondary | ICD-10-CM | POA: Diagnosis not present

## 2023-12-04 DIAGNOSIS — I5022 Chronic systolic (congestive) heart failure: Secondary | ICD-10-CM | POA: Diagnosis not present

## 2023-12-04 DIAGNOSIS — I824Y9 Acute embolism and thrombosis of unspecified deep veins of unspecified proximal lower extremity: Secondary | ICD-10-CM | POA: Diagnosis not present

## 2023-12-04 DIAGNOSIS — N1832 Chronic kidney disease, stage 3b: Secondary | ICD-10-CM | POA: Diagnosis not present

## 2023-12-04 DIAGNOSIS — I10 Essential (primary) hypertension: Secondary | ICD-10-CM | POA: Diagnosis not present

## 2023-12-11 DIAGNOSIS — E039 Hypothyroidism, unspecified: Secondary | ICD-10-CM | POA: Diagnosis not present

## 2023-12-18 DIAGNOSIS — E782 Mixed hyperlipidemia: Secondary | ICD-10-CM | POA: Diagnosis not present

## 2023-12-18 DIAGNOSIS — I130 Hypertensive heart and chronic kidney disease with heart failure and stage 1 through stage 4 chronic kidney disease, or unspecified chronic kidney disease: Secondary | ICD-10-CM | POA: Diagnosis not present

## 2023-12-18 DIAGNOSIS — F325 Major depressive disorder, single episode, in full remission: Secondary | ICD-10-CM | POA: Diagnosis not present

## 2023-12-18 DIAGNOSIS — E039 Hypothyroidism, unspecified: Secondary | ICD-10-CM | POA: Diagnosis not present

## 2023-12-18 DIAGNOSIS — Z0001 Encounter for general adult medical examination with abnormal findings: Secondary | ICD-10-CM | POA: Diagnosis not present

## 2023-12-18 DIAGNOSIS — F419 Anxiety disorder, unspecified: Secondary | ICD-10-CM | POA: Diagnosis not present

## 2023-12-18 DIAGNOSIS — Z1331 Encounter for screening for depression: Secondary | ICD-10-CM | POA: Diagnosis not present

## 2023-12-18 DIAGNOSIS — I5022 Chronic systolic (congestive) heart failure: Secondary | ICD-10-CM | POA: Diagnosis not present

## 2023-12-18 DIAGNOSIS — Z0000 Encounter for general adult medical examination without abnormal findings: Secondary | ICD-10-CM | POA: Diagnosis not present

## 2024-04-15 DIAGNOSIS — R21 Rash and other nonspecific skin eruption: Secondary | ICD-10-CM | POA: Diagnosis not present
